# Patient Record
Sex: Female | Born: 1965 | Race: White | Hispanic: No | Marital: Single | Smoking: Never smoker
Health system: Southern US, Community
[De-identification: ages and names within clinical notes are randomized; demographics above are authoritative.]

## PROBLEM LIST (undated history)

## (undated) DIAGNOSIS — I10 Essential (primary) hypertension: Secondary | ICD-10-CM

## (undated) DIAGNOSIS — R519 Headache, unspecified: Secondary | ICD-10-CM

## (undated) DIAGNOSIS — R51 Headache: Secondary | ICD-10-CM

## (undated) HISTORY — DX: Headache: R51

## (undated) HISTORY — DX: Headache, unspecified: R51.9

## (undated) HISTORY — DX: Essential (primary) hypertension: I10

## (undated) HISTORY — PX: TOOTH EXTRACTION: SUR596

## (undated) HISTORY — PX: WISDOM TOOTH EXTRACTION: SHX21

---

## 2013-10-04 ENCOUNTER — Ambulatory Visit: Payer: BC Managed Care – PPO | Admitting: Family Medicine

## 2013-11-05 ENCOUNTER — Ambulatory Visit (INDEPENDENT_AMBULATORY_CARE_PROVIDER_SITE_OTHER): Payer: BC Managed Care – PPO | Admitting: Family Medicine

## 2013-11-05 ENCOUNTER — Other Ambulatory Visit (HOSPITAL_COMMUNITY)
Admission: RE | Admit: 2013-11-05 | Discharge: 2013-11-05 | Disposition: A | Discharge status: Home / Self Care | Payer: BC Managed Care – PPO | Source: Ambulatory Visit | Attending: Family Medicine | Admitting: Family Medicine

## 2013-11-05 ENCOUNTER — Encounter: Payer: Self-pay | Admitting: Family Medicine

## 2013-11-05 VITALS — BP 116/64 | HR 59 | Temp 98.0°F | Ht 70.0 in | Wt 225.5 lb

## 2013-11-05 DIAGNOSIS — Z0000 Encounter for general adult medical examination without abnormal findings: Secondary | ICD-10-CM | POA: Insufficient documentation

## 2013-11-05 DIAGNOSIS — Z136 Encounter for screening for cardiovascular disorders: Secondary | ICD-10-CM

## 2013-11-05 DIAGNOSIS — Z124 Encounter for screening for malignant neoplasm of cervix: Secondary | ICD-10-CM | POA: Insufficient documentation

## 2013-11-05 DIAGNOSIS — Z7689 Persons encountering health services in other specified circumstances: Secondary | ICD-10-CM | POA: Insufficient documentation

## 2013-11-05 DIAGNOSIS — Z1151 Encounter for screening for human papillomavirus (HPV): Secondary | ICD-10-CM | POA: Insufficient documentation

## 2013-11-05 DIAGNOSIS — Z1231 Encounter for screening mammogram for malignant neoplasm of breast: Secondary | ICD-10-CM

## 2013-11-05 DIAGNOSIS — Z01419 Encounter for gynecological examination (general) (routine) without abnormal findings: Secondary | ICD-10-CM

## 2013-11-05 NOTE — Assessment & Plan Note (Signed)
Pap smear today. Order placed for mammogram- she will call to schedule.

## 2013-11-05 NOTE — Assessment & Plan Note (Addendum)
Reviewed preventive care protocols, scheduled due services, and updated immunizations Discussed nutrition, exercise, diet, and healthy lifestyle.  Orders Placed This Encounter  Procedures  . MM Digital Screening  . CBC with Differential  . Comprehensive metabolic panel  . Lipid panel  . TSH     

## 2013-11-05 NOTE — Addendum Note (Signed)
Addended by: Alvina ChouWALSH, Tulio Facundo J on: 11/05/2013 03:23 PM   Modules accepted: Orders

## 2013-11-05 NOTE — Progress Notes (Signed)
Subjective:   Patient ID: [name], female    DOB: 11-22-1965, 48 y.o.   MRN: [id number]  Maymie Brunke is a pleasant 48 y.o. year old female who presents to clinic today with Establish Care  on 11/05/2013  HPI: G2P1 here to establish care and for CPX.  Does not take any regular medications.  LMP started last Thursday. No h/o abnormal pap smears in last 5 years. Last mammogram over 7 years ago.  No family h/o breast, uterine, cervical or colon CA.  She would like screening blood work done today.  Patient Active Problem List   Diagnosis Date Noted  . Routine general medical examination at a health care facility 11/05/2013  . Encounter for routine gynecological examination 11/05/2013   No past medical history on file. Past Surgical History  Procedure Laterality Date  . Tooth extraction    . Wisdom tooth extraction     History  Substance Use Topics  . Smoking status: Never Smoker   . Smokeless tobacco: Never Used  . Alcohol Use: Yes   Family History  Problem Relation Age of Onset  . Cancer Mother   . Hypertension Mother   . Stroke Mother   . Cancer Father    Allergies  Allergen Reactions  . Penicillins    No current outpatient prescriptions on file prior to visit.   No current facility-administered medications on file prior to visit.   The PMH, PSH, Social History, Family History, Medications, and allergies have been reviewed in [facility], and have been updated if relevant.   Review of Systems    See HPI Patient reports no  vision/ hearing changes,anorexia, weight change, fever ,adenopathy, persistant / recurrent hoarseness, swallowing issues, chest pain, edema,persistant / recurrent cough, hemoptysis, dyspnea(rest, exertional, paroxysmal nocturnal), gastrointestinal  bleeding (melena, rectal bleeding), abdominal pain, excessive heart burn, GU symptoms(dysuria, hematuria, pyuria, voiding/incontinence  Issues) syncope, focal weakness, severe memory loss,  concerning skin lesions, depression, anxiety, abnormal bruising/bleeding, major joint swelling, breast masses or abnormal vaginal bleeding.    Objective:    BP 116/64  Pulse 59  Temp(Src) 98 F (36.7 C) (Oral)  Ht 5\' 10"  (1.778 m)  Wt 225 lb 8 oz (102.286 kg)  BMI 32.36 kg/m2  SpO2 99%  LMP 11/01/2013   Physical Exam   General:  Well-developed,well-nourished,in no acute distress; alert,appropriate and cooperative throughout examination Head:  normocephalic and atraumatic.   Eyes:  vision grossly intact, pupils equal, pupils round, and pupils reactive to light.   Ears:  R ear normal and L ear normal.   Nose:  no external deformity.   Mouth:  good dentition.   Neck:  No deformities, masses, or tenderness noted. Breasts:  No mass, nodules, thickening, tenderness, bulging, retraction, inflamation, nipple discharge or skin changes noted.   Lungs:  Normal respiratory effort, chest expands symmetrically. Lungs are clear to auscultation, no crackles or wheezes. Heart:  Normal rate and regular rhythm. S1 and S2 normal without gallop, murmur, click, rub or other extra sounds. Abdomen:  Bowel sounds positive,abdomen soft and non-tender without masses, organomegaly or hernias noted. Rectal:  no external abnormalities.   Genitalia:  Pelvic Exam:        External: normal female genitalia without lesions or masses        Vagina: normal without lesions or masses        Cervix: normal without lesions or masses        Adnexa: normal bimanual exam without masses or fullness  Uterus: normal by palpation        Pap smear: performed Msk:  No deformity or scoliosis noted of thoracic or lumbar spine.   Extremities:  No clubbing, cyanosis, edema, or deformity noted with normal full range of motion of all joints.   Neurologic:  alert & oriented X3 and gait normal.   Skin:  Intact without suspicious lesions or rashes Cervical Nodes:  No lymphadenopathy noted Axillary Nodes:  No palpable  lymphadenopathy Psych:  Cognition and judgment appear intact. Alert and cooperative with normal attention span and concentration. No apparent delusions, illusions, hallucinations       Assessment & Plan:   Routine general medical examination at a health care facility - Plan: CBC with Differential, Comprehensive metabolic panel, TSH  Encounter for routine gynecological examination  Screening for ischemic heart disease - Plan: Lipid panel  Other screening mammogram - Plan: MM Digital Screening No Follow-up on file.

## 2013-11-05 NOTE — Progress Notes (Signed)
Pre visit review using our clinic review tool, if applicable. No additional management support is needed unless otherwise documented below in the visit note. 

## 2013-11-05 NOTE — Patient Instructions (Signed)
It was nice to meet you. We will call you with your lab results and you can view them online.  Please call [facility] to set up your mammogram at your convenience.

## 2013-11-08 ENCOUNTER — Encounter: Payer: Self-pay | Admitting: *Deleted

## 2015-01-01 ENCOUNTER — Ambulatory Visit: Payer: Self-pay | Admitting: Family Medicine

## 2015-01-01 ENCOUNTER — Telehealth: Payer: Self-pay | Admitting: Family Medicine

## 2015-01-01 NOTE — Telephone Encounter (Signed)
Patient did not come in for their appointment today for no energy.  Please let me know if patient needs to be contacted immediately for follow up or no follow up needed.

## 2015-01-01 NOTE — Telephone Encounter (Signed)
She did come in but was late.

## 2015-01-29 ENCOUNTER — Other Ambulatory Visit: Payer: Self-pay

## 2015-01-29 DIAGNOSIS — Z1231 Encounter for screening mammogram for malignant neoplasm of breast: Secondary | ICD-10-CM

## 2015-02-03 ENCOUNTER — Ambulatory Visit
Admission: RE | Admit: 2015-02-03 | Discharge: 2015-02-03 | Disposition: A | Discharge status: Home / Self Care | Payer: BLUE CROSS/BLUE SHIELD | Source: Ambulatory Visit

## 2015-02-03 DIAGNOSIS — Z1231 Encounter for screening mammogram for malignant neoplasm of breast: Secondary | ICD-10-CM

## 2017-05-05 ENCOUNTER — Ambulatory Visit: Payer: BLUE CROSS/BLUE SHIELD | Admitting: Family

## 2017-05-12 ENCOUNTER — Ambulatory Visit (INDEPENDENT_AMBULATORY_CARE_PROVIDER_SITE_OTHER): Payer: BLUE CROSS/BLUE SHIELD | Admitting: Family

## 2017-05-12 ENCOUNTER — Encounter: Payer: Self-pay | Admitting: Family

## 2017-05-12 VITALS — BP 122/68 | HR 70 | Temp 97.9°F | Ht 71.0 in | Wt 233.0 lb

## 2017-05-12 DIAGNOSIS — R51 Headache: Secondary | ICD-10-CM | POA: Diagnosis not present

## 2017-05-12 DIAGNOSIS — Z7689 Persons encountering health services in other specified circumstances: Secondary | ICD-10-CM

## 2017-05-12 DIAGNOSIS — R519 Headache, unspecified: Secondary | ICD-10-CM

## 2017-05-12 MED ORDER — TOPIRAMATE 50 MG PO TABS: 25.0000 mg | ORAL_TABLET | Freq: Every day | ORAL | 0 refills | Status: DC

## 2017-05-12 MED ORDER — TOPIRAMATE 25 MG PO TABS: 25.0000 mg | ORAL_TABLET | Freq: Every day | ORAL | 0 refills | Status: DC

## 2017-05-12 NOTE — Patient Instructions (Addendum)
Schedule 3d mammogram in [place] as discussed  If you need topamax, fine to start however start at  at night.   Return for fasting labs and physical.

## 2017-05-12 NOTE — Assessment & Plan Note (Signed)
Reassured by normal neurologic exam. Discussed with patient that she's not having headaches very frequently however her preference is to use a daily preventative medication as opposed to an abortive treatment.  I advised her start topamax 25 mg dose if she were to start medication and to let me know. Also advised patient to me know if headache frequency, quality were to change.

## 2017-05-12 NOTE — Progress Notes (Signed)
Subjective:    Patient ID: Brianna Wagner, female    DOB: 1966/05/28, 51 y.o.   MRN: [id number]  CC: Brianna Wagner is a 51 y.o. female who presents today to establish care.    HPI: Feeling well today.  Chronic HA's- had been on topamax 50 mg which kept headaches under control. Overall HA's have improved and unchanged from HA's in the past. Last HA week.  HA's couple times per month.  Present bilateral forehead and posterior head. Lasts 3 days, no relief from OTC medications typically.  No numbness, tingling, confusion, aura. No triggers that she could figure out.   Last pcp 1.5 year ago.      HISTORY:  Past Medical History:  Diagnosis Date  . Frequent headaches    Past Surgical History:  Procedure Laterality Date  . TOOTH EXTRACTION    . WISDOM TOOTH EXTRACTION     Family History  Problem Relation Age of Onset  . Cancer Mother   . Hypertension Mother   . Stroke Mother   . Cancer Father   . Cancer Sister        breast    Allergies: Desogestrel-ethinyl estradiol and Penicillins No current outpatient prescriptions on file prior to visit.   No current facility-administered medications on file prior to visit.     Social History  Substance Use Topics  . Smoking status: Never Smoker  . Smokeless tobacco: Never Used  . Alcohol use Yes    Review of Systems  Constitutional: Negative for chills and fever.  Eyes: Negative for visual disturbance.  Respiratory: Negative for cough.   Cardiovascular: Negative for chest pain and palpitations.  Gastrointestinal: Negative for nausea and vomiting.  Neurological: Positive for headaches. Negative for syncope and weakness.      Objective:    BP 122/68   Pulse 70   Temp 97.9 F (36.6 C) (Oral)   Ht  (1.803 m)   Wt 233 lb (105.7 kg)   SpO2 98%   BMI 32.50 kg/m  BP Readings from Last 3 Encounters:  05/12/17 122/68  11/05/13 116/64   Wt Readings from Last 3 Encounters:  05/12/17 233 lb (105.7 kg)  11/05/13 225  lb 8 oz (102.3 kg)    Physical Exam  Constitutional: She appears well-developed and well-nourished.  HENT:  Mouth/Throat: Uvula is midline, oropharynx is clear and moist and mucous membranes are normal.  Eyes: Pupils are equal, round, and reactive to light. Conjunctivae and EOM are normal.  Fundus normal bilaterally.   Cardiovascular: Normal rate, regular rhythm, normal heart sounds and normal pulses.   Pulmonary/Chest: Effort normal and breath sounds normal. She has no wheezes. She has no rhonchi. She has no rales.  Neurological: She is alert. She has normal strength. No cranial nerve deficit or sensory deficit. She displays a negative Romberg sign.  Reflex Scores:      Bicep reflexes are 2+ on the right side and 2+ on the left side.      Patellar reflexes are 2+ on the right side and 2+ on the left side. Grip equal and strong bilateral upper extremities. Gait strong and steady. Able to perform rapid alternating movement without difficulty.   Skin: Skin is warm and dry.  Psychiatric: She has a normal mood and affect. Her speech is normal and behavior is normal. Thought content normal.  Vitals reviewed.      Assessment & Plan:   Problem List Items Addressed This Visit      Other  Encounter to establish care - Primary    Patient return for CPE. Mammogram was ordered today and patient understands to schedule.      Relevant Orders   MM SCREENING BREAST TOMO BILATERAL   Acute nonintractable headache    Reassured by normal neurologic exam. Discussed with patient that she's not having headaches very frequently however her preference is to use a daily preventative medication as opposed to an abortive treatment.  I advised her start topamax 25 mg dose if she were to start medication and to let me know. Also advised patient to me know if headache frequency, quality were to change.      Relevant Medications   topiramate (TOPAMAX) 50 MG tablet       I have discontinued Ms. Matar's  topiramate. I am also having her start on topiramate.   Meds ordered this encounter  Medications  . DISCONTD: topiramate (TOPAMAX) 25 MG tablet    Sig: Take 1 tablet (25 mg total) by mouth at bedtime.    Dispense:  90 tablet    Refill:  0    Order Specific Question:   Supervising Provider    Answer:   Duncan Dull L [[id number]]  . topiramate (TOPAMAX) 50 MG tablet    Sig: Take 0.5 tablets (25 mg total) by mouth at bedtime.    Dispense:  90 tablet    Refill:  0    Order Specific Question:   Supervising Provider    Answer:   Sherlene Shams [[id number]]    Return precautions given.   Risks, benefits, and alternatives of the medications and treatment plan prescribed today were discussed, and patient expressed understanding.   Education regarding symptom management and diagnosis given to patient on AVS.  Continue to follow with [name], FNP for routine health maintenance.   Brianna Wagner and I agreed with plan.   Rennie Plowman, FNP

## 2017-05-12 NOTE — Assessment & Plan Note (Signed)
Patient return for CPE. Mammogram was ordered today and patient understands to schedule.

## 2017-05-12 NOTE — Progress Notes (Signed)
Pre visit review using our clinic review tool, if applicable. No additional management support is needed unless otherwise documented below in the visit note. 

## 2017-06-02 ENCOUNTER — Ambulatory Visit (INDEPENDENT_AMBULATORY_CARE_PROVIDER_SITE_OTHER): Payer: BLUE CROSS/BLUE SHIELD | Admitting: Family

## 2017-06-02 ENCOUNTER — Encounter: Payer: Self-pay | Admitting: Family

## 2017-06-02 VITALS — BP 130/100 | HR 52 | Temp 98.2°F | Resp 14 | Ht 70.75 in | Wt 233.0 lb

## 2017-06-02 DIAGNOSIS — R030 Elevated blood-pressure reading, without diagnosis of hypertension: Secondary | ICD-10-CM

## 2017-06-02 DIAGNOSIS — Z0000 Encounter for general adult medical examination without abnormal findings: Secondary | ICD-10-CM

## 2017-06-02 DIAGNOSIS — R51 Headache: Secondary | ICD-10-CM | POA: Diagnosis not present

## 2017-06-02 DIAGNOSIS — I10 Essential (primary) hypertension: Secondary | ICD-10-CM | POA: Insufficient documentation

## 2017-06-02 DIAGNOSIS — R519 Headache, unspecified: Secondary | ICD-10-CM

## 2017-06-02 MED ORDER — TOPIRAMATE 50 MG PO TABS: 25.0000 mg | ORAL_TABLET | Freq: Every day | ORAL | 0 refills | Status: DC

## 2017-06-02 NOTE — Assessment & Plan Note (Addendum)
Declines colonoscopy at this time. Patient will schedule mammogram. CBE performed. H/o hashimoto's -pending thyroid studies. Labs today.

## 2017-06-02 NOTE — Patient Instructions (Addendum)
Call and schedule mammogram at your conviencene in [place] as discussed  Labs  Monitor blood pressure,  Goal is less than 130/80; if persistently higher, please make sooner follow up appointment so we can recheck you blood pressure and manage medications  Health Maintenance, Female Adopting a healthy lifestyle and getting preventive care can go a long way to promote health and wellness. Talk with your health care provider about what schedule of regular examinations is right for you. This is a good chance for you to check in with your provider about disease prevention and staying healthy. In between checkups, there are plenty of things you can do on your own. Experts have done a lot of research about which lifestyle changes and preventive measures are most likely to keep you healthy. Ask your health care provider for more information. Weight and diet Eat a healthy diet  Be sure to include plenty of vegetables, fruits, low-fat dairy products, and lean protein.  Do not eat a lot of foods high in solid fats, added sugars, or salt.  Get regular exercise. This is one of the most important things you can do for your health. ? Most adults should exercise for at least 150 minutes each week. The exercise should increase your heart rate and make you sweat (moderate-intensity exercise). ? Most adults should also do strengthening exercises at least twice a week. This is in addition to the moderate-intensity exercise.  Maintain a healthy weight  Body mass index (BMI) is a measurement that can be used to identify possible weight problems. It estimates body fat based on height and weight. Your health care provider can help determine your BMI and help you achieve or maintain a healthy weight.  For females 36 years of age and older: ? A BMI below 18.5 is considered underweight. ? A BMI of 18.5 to 24.9 is normal. ? A BMI of 25 to 29.9 is considered overweight. ? A BMI of 30 and above is  considered obese.  Watch levels of cholesterol and blood lipids  You should start having your blood tested for lipids and cholesterol at 50 years of age, then have this test every 5 years.  You may need to have your cholesterol levels checked more often if: ? Your lipid or cholesterol levels are high. ? You are older than 50 years of age. ? You are at high risk for heart disease.  Cancer screening Lung Cancer  Lung cancer screening is recommended for adults 24-72 years old who are at high risk for lung cancer because of a history of smoking.  A yearly low-dose CT scan of the lungs is recommended for people who: ? Currently smoke. ? Have quit within the past 15 years. ? Have at least a 30-pack-year history of smoking. A pack year is smoking an average of one pack of cigarettes a day for 1 year.  Yearly screening should continue until it has been 15 years since you quit.  Yearly screening should stop if you develop a health problem that would prevent you from having lung cancer treatment.  Breast Cancer  Practice breast self-awareness. This means understanding how your breasts normally appear and feel.  It also means doing regular breast self-exams. Let your health care provider know about any changes, no matter how small.  If you are in your 20s or 30s, you should have a clinical breast exam (CBE) by a health care provider every 1-3 years as part of a regular health exam.  If you  are 40 or older, have a CBE every year. Also consider having a breast X-ray (mammogram) every year.  If you have a family history of breast cancer, talk to your health care provider about genetic screening.  If you are at high risk for breast cancer, talk to your health care provider about having an MRI and a mammogram every year.  Breast cancer gene (BRCA) assessment is recommended for women who have family members with BRCA-related cancers. BRCA-related cancers  include: ? Breast. ? Ovarian. ? Tubal. ? Peritoneal cancers.  Results of the assessment will determine the need for genetic counseling and BRCA1 and BRCA2 testing.  Cervical Cancer Your health care provider may recommend that you be screened regularly for cancer of the pelvic organs (ovaries, uterus, and vagina). This screening involves a pelvic examination, including checking for microscopic changes to the surface of your cervix (Pap test). You may be encouraged to have this screening done every 3 years, beginning at age 21.  For women ages 30-65, health care providers may recommend pelvic exams and Pap testing every 3 years, or they may recommend the Pap and pelvic exam, combined with testing for human papilloma virus (HPV), every 5 years. Some types of HPV increase your risk of cervical cancer. Testing for HPV may also be done on women of any age with unclear Pap test results.  Other health care providers may not recommend any screening for nonpregnant women who are considered low risk for pelvic cancer and who do not have symptoms. Ask your health care provider if a screening pelvic exam is right for you.  If you have had past treatment for cervical cancer or a condition that could lead to cancer, you need Pap tests and screening for cancer for at least 20 years after your treatment. If Pap tests have been discontinued, your risk factors (such as having a new sexual partner) need to be reassessed to determine if screening should resume. Some women have medical problems that increase the chance of getting cervical cancer. In these cases, your health care provider may recommend more frequent screening and Pap tests.  Colorectal Cancer  This type of cancer can be detected and often prevented.  Routine colorectal cancer screening usually begins at 50 years of age and continues through 50 years of age.  Your health care provider may recommend screening at an earlier age if you have risk factors  for colon cancer.  Your health care provider may also recommend using home test kits to check for hidden blood in the stool.  A small camera at the end of a tube can be used to examine your colon directly (sigmoidoscopy or colonoscopy). This is done to check for the earliest forms of colorectal cancer.  Routine screening usually begins at age 50.  Direct examination of the colon should be repeated every 5-10 years through 50 years of age. However, you may need to be screened more often if early forms of precancerous polyps or small growths are found.  Skin Cancer  Check your skin from head to toe regularly.  Tell your health care provider about any new moles or changes in moles, especially if there is a change in a mole's shape or color.  Also tell your health care provider if you have a mole that is larger than the size of a pencil eraser.  Always use sunscreen. Apply sunscreen liberally and repeatedly throughout the day.  Protect yourself by wearing long sleeves, pants, a wide-brimmed hat, and sunglasses whenever   you are outside.  Heart disease, diabetes, and high blood pressure  High blood pressure causes heart disease and increases the risk of stroke. High blood pressure is more likely to develop in: ? People who have blood pressure in the high end of the normal range (130-139/85-89 mm Hg). ? People who are overweight or obese. ? People who are African American.  If you are 17-68 years of age, have your blood pressure checked every 3-5 years. If you are 76 years of age or older, have your blood pressure checked every year. You should have your blood pressure measured twice-once when you are at a hospital or clinic, and once when you are not at a hospital or clinic. Record the average of the two measurements. To check your blood pressure when you are not at a hospital or clinic, you can use: ? An automated blood pressure machine at a pharmacy. ? A home blood pressure monitor.  If  you are between 40 years and 61 years old, ask your health care provider if you should take aspirin to prevent strokes.  Have regular diabetes screenings. This involves taking a blood sample to check your fasting blood sugar level. ? If you are at a normal weight and have a low risk for diabetes, have this test once every three years after 50 years of age. ? If you are overweight and have a high risk for diabetes, consider being tested at a younger age or more often. Preventing infection Hepatitis B  If you have a higher risk for hepatitis B, you should be screened for this virus. You are considered at high risk for hepatitis B if: ? You were born in a country where hepatitis B is common. Ask your health care provider which countries are considered high risk. ? Your parents were born in a high-risk country, and you have not been immunized against hepatitis B (hepatitis B vaccine). ? You have HIV or AIDS. ? You use needles to inject street drugs. ? You live with someone who has hepatitis B. ? You have had sex with someone who has hepatitis B. ? You get hemodialysis treatment. ? You take certain medicines for conditions, including cancer, organ transplantation, and autoimmune conditions.  Hepatitis C  Blood testing is recommended for: ? Everyone born from [birth year] through 1964. ? Anyone with known risk factors for hepatitis C.  Sexually transmitted infections (STIs)  You should be screened for sexually transmitted infections (STIs) including gonorrhea and chlamydia if: ? You are sexually active and are younger than 50 years of age. ? You are older than 50 years of age and your health care provider tells you that you are at risk for this type of infection. ? Your sexual activity has changed since you were last screened and you are at an increased risk for chlamydia or gonorrhea. Ask your health care provider if you are at risk.  If you do not have HIV, but are at risk, it may be recommended  that you take a prescription medicine daily to prevent HIV infection. This is called pre-exposure prophylaxis (PrEP). You are considered at risk if: ? You are sexually active and do not regularly use condoms or know the HIV status of your partner(s). ? You take drugs by injection. ? You are sexually active with a partner who has HIV.  Talk with your health care provider about whether you are at high risk of being infected with HIV. If you choose to begin PrEP, you should first  be tested for HIV. You should then be tested every 3 months for as long as you are taking PrEP. Pregnancy  If you are premenopausal and you may become pregnant, ask your health care provider about preconception counseling.  If you may become pregnant, take 400 to 800 micrograms (mcg) of folic acid every day.  If you want to prevent pregnancy, talk to your health care provider about birth control (contraception). Osteoporosis and menopause  Osteoporosis is a disease in which the bones lose minerals and strength with aging. This can result in serious bone fractures. Your risk for osteoporosis can be identified using a bone density scan.  If you are 43 years of age or older, or if you are at risk for osteoporosis and fractures, ask your health care provider if you should be screened.  Ask your health care provider whether you should take a calcium or vitamin D supplement to lower your risk for osteoporosis.  Menopause may have certain physical symptoms and risks.  Hormone replacement therapy may reduce some of these symptoms and risks. Talk to your health care provider about whether hormone replacement therapy is right for you. Follow these instructions at home:  Schedule regular health, dental, and eye exams.  Stay current with your immunizations.  Do not use any tobacco products including cigarettes, chewing tobacco, or electronic cigarettes.  If you are pregnant, do not drink alcohol.  If you are  breastfeeding, limit how much and how often you drink alcohol.  Limit alcohol intake to no more than 1 drink per day for nonpregnant women. One drink equals 12 ounces of beer, 5 ounces of wine, or 1 ounces of hard liquor.  Do not use street drugs.  Do not share needles.  Ask your health care provider for help if you need support or information about quitting drugs.  Tell your health care provider if you often feel depressed.  Tell your health care provider if you have ever been abused or do not feel safe at home. This information is not intended to replace advice given to you by your health care provider. Make sure you discuss any questions you have with your health care provider. Document Released: 02/08/2011 Document Revised: 01/01/2016 Document Reviewed: 04/29/2015 Elsevier Interactive Patient Education  [employer].

## 2017-06-02 NOTE — Progress Notes (Signed)
Subjective:    Patient ID: Brianna Copperorothy Wagner, female    DOB: 1966/02/28, 51 y.o.   MRN: [id number]  CC: Brianna CopperDorothy Rautio is a 51 y.o. female who presents today for physical exam.    HPI: HA- started topamax at last visit, 25mg . Have already noticed decrease in headaches and intensity. Would like to increase to 50mg  qhs.   H/o hashimoto's thyroiditis- had seen Dr Marjorie SmolderMoradi in the past for thyroid uptake scan 2003 which showed hot nodule.  Elevated antibodies at that time. No palpitations, skin changes, cold/heat intolerance        Colorectal Cancer Screening: due; declines at this time.  Breast Cancer Screening: due Cervical Cancer Screening: due; on cycle and declines exam today; will come back for this . Bone Health screening/DEXA for 65+: No increased fracture risk. Defer screening at this time Lung Cancer Screening: Doesn't have 30 year pack year history and age > 55 years.       Tetanus - due         HIV Screening- Candidate for ,declines Labs: Screening labs today. Exercise: Gets regular exercise.  Alcohol use: Moderate Smoking/tobacco use: Nonsmoker.  Regular dental exams: utd Wears seat belt: Yes. Skin: no concerning lesions today; follows with dermatology  HISTORY:  Past Medical History:  Diagnosis Date  . Frequent headaches     Past Surgical History:  Procedure Laterality Date  . TOOTH EXTRACTION    . WISDOM TOOTH EXTRACTION     Family History  Problem Relation Age of Onset  . Cancer Mother   . Hypertension Mother   . Stroke Mother   . Cancer Father   . Cancer Sister        breast      ALLERGIES: Desogestrel-ethinyl estradiol and Penicillins  No current outpatient prescriptions on file prior to visit.   No current facility-administered medications on file prior to visit.     Social History  Substance Use Topics  . Smoking status: Never Smoker  . Smokeless tobacco: Never Used  . Alcohol use Yes    Review of Systems  Constitutional: Negative for  chills, fever and unexpected weight change.  HENT: Negative for congestion.   Respiratory: Negative for cough.   Cardiovascular: Negative for chest pain, palpitations and leg swelling.  Gastrointestinal: Negative for nausea and vomiting.  Genitourinary: Negative for dyspareunia and vaginal pain.  Musculoskeletal: Negative for arthralgias and myalgias.  Skin: Negative for rash.  Neurological: Positive for headaches.  Hematological: Negative for adenopathy.  Psychiatric/Behavioral: Negative for confusion.      Objective:    BP (!) 130/100 (BP Location: Right Arm, Patient Position: Sitting, Cuff Size: Large)   Pulse (!) 52   Temp 98.2 F (36.8 C) (Oral)   Resp 14   Ht 5' 10.75" (1.797 m)   Wt 233 lb (105.7 kg)   LMP 05/31/2017   SpO2 98%   BMI 32.73 kg/m   BP Readings from Last 3 Encounters:  06/02/17 (!) 130/100  05/12/17 122/68  11/05/13 116/64   Wt Readings from Last 3 Encounters:  06/02/17 233 lb (105.7 kg)  05/12/17 233 lb (105.7 kg)  11/05/13 225 lb 8 oz (102.3 kg)    Physical Exam  Constitutional: She appears well-developed and well-nourished.  Eyes: Conjunctivae are normal.  Neck: No thyroid mass and no thyromegaly present.  Cardiovascular: Normal rate, regular rhythm, normal heart sounds and normal pulses.   Pulmonary/Chest: Effort normal and breath sounds normal. She has no wheezes. She has no rhonchi. She  has no rales. Right breast exhibits no inverted nipple, no mass, no nipple discharge, no skin change and no tenderness. Left breast exhibits no inverted nipple, no mass, no nipple discharge, no skin change and no tenderness. Breasts are symmetrical.  CBE performed.   Lymphadenopathy:       Head (right side): No submental, no submandibular, no tonsillar, no preauricular, no posterior auricular and no occipital adenopathy present.       Head (left side): No submental, no submandibular, no tonsillar, no preauricular, no posterior auricular and no occipital  adenopathy present.    She has no cervical adenopathy.       Right cervical: No superficial cervical, no deep cervical and no posterior cervical adenopathy present.      Left cervical: No superficial cervical, no deep cervical and no posterior cervical adenopathy present.    She has no axillary adenopathy.  Neurological: She is alert.  Skin: Skin is warm and dry.  Psychiatric: She has a normal mood and affect. Her speech is normal and behavior is normal. Thought content normal.  Vitals reviewed.      Assessment & Plan:   Problem List Items Addressed This Visit      Other   Routine general medical examination at a health care facility - Primary    Declines colonoscopy at this time. Patient will schedule mammogram. CBE performed. H/o hashimoto's -pending thyroid studies. Labs today.       Relevant Orders   MM SCREENING BREAST TOMO BILATERAL   US THYROID   CBC w/Diff   TSH   Hemoglobin A1c   Vitamin D (25 hydroxy)   Comprehensive metabolic panel   T4, free   T3, free   Lipid panel   Thyrotropin receptor autoabs   Acute nonintractable headache    Improved. Will trial 50mg  topamax qhs.       Relevant Medications   topiramate (TOPAMAX) 50 MG tablet   Elevated blood pressure reading    Elevated today.atypical for patient. She prefers to monitor at home and she will call me and let me know if persists          I am having Ms. Ing maintain her topiramate.   Meds ordered this encounter  Medications  . topiramate (TOPAMAX) 50 MG tablet    Sig: Take 0.5 tablets (25 mg total) by mouth at bedtime.    Dispense:  90 tablet    Refill:  0    Order Specific Question:   Supervising Provider    Answer:   Sherlene Shams [[id number]]    Return precautions given.   Risks, benefits, and alternatives of the medications and treatment plan prescribed today were discussed, and patient expressed understanding.   Education regarding symptom management and diagnosis given to patient on  AVS.   Continue to follow with [name], FNP for routine health maintenance.   Brianna Copper and I agreed with plan.   Rennie Plowman, FNP

## 2017-06-02 NOTE — Assessment & Plan Note (Signed)
Improved. Will trial 50mg  topamax qhs.

## 2017-06-02 NOTE — Assessment & Plan Note (Signed)
Elevated today.atypical for patient. She prefers to monitor at home and she will call me and let me know if persists

## 2017-06-06 LAB — COMPREHENSIVE METABOLIC PANEL
AG RATIO: 1.4 (calc) (ref 1.0–2.5)
ALT: 11 U/L (ref 6–29)
AST: 18 U/L (ref 10–35)
Albumin: 4.2 g/dL (ref 3.6–5.1)
Alkaline phosphatase (APISO): 50 U/L (ref 33–130)
BUN: 15 mg/dL (ref 7–25)
CO2: 25 mmol/L (ref 20–32)
Calcium: 9.4 mg/dL (ref 8.6–10.4)
Chloride: 104 mmol/L (ref 98–110)
Creat: 0.9 mg/dL (ref 0.50–1.05)
Globulin: 3 g/dL (calc) (ref 1.9–3.7)
Glucose, Bld: 91 mg/dL (ref 65–99)
Potassium: 4.1 mmol/L (ref 3.5–5.3)
Sodium: 136 mmol/L (ref 135–146)
Total Bilirubin: 0.7 mg/dL (ref 0.2–1.2)
Total Protein: 7.2 g/dL (ref 6.1–8.1)

## 2017-06-06 LAB — CBC WITH DIFFERENTIAL/PLATELET
BASOS ABS: 72 {cells}/uL (ref 0–200)
Basophils Relative: 1.5 %
Eosinophils Absolute: 154 cells/uL (ref 15–500)
Eosinophils Relative: 3.2 %
HCT: 36 % (ref 35.0–45.0)
Hemoglobin: 11.9 g/dL (ref 11.7–15.5)
Lymphs Abs: 1210 cells/uL (ref 850–3900)
MCH: 28.1 pg (ref 27.0–33.0)
MCHC: 33.1 g/dL (ref 32.0–36.0)
MCV: 85.1 fL (ref 80.0–100.0)
MPV: 11.6 fL (ref 7.5–12.5)
Monocytes Relative: 10.5 %
NEUTROS PCT: 59.6 %
Neutro Abs: 2861 cells/uL (ref 1500–7800)
Platelets: 293 10*3/uL (ref 140–400)
RBC: 4.23 10*6/uL (ref 3.80–5.10)
RDW: 13.3 % (ref 11.0–15.0)
Total Lymphocyte: 25.2 %
WBC mixed population: 504 cells/uL (ref 200–950)
WBC: 4.8 10*3/uL (ref 3.8–10.8)

## 2017-06-06 LAB — LIPID PANEL
CHOLESTEROL: 123 mg/dL (ref <200)
HDL: 49 mg/dL — ABNORMAL LOW (ref >50)
LDL CHOLESTEROL (CALC): 58 mg/dL
Non-HDL Cholesterol (Calc): 74 mg/dL (calc) (ref <130)
Total CHOL/HDL Ratio: 2.5 (calc) (ref <5.0)
Triglycerides: 79 mg/dL (ref <150)

## 2017-06-06 LAB — HEMOGLOBIN A1C
HEMOGLOBIN A1C: 5 %{Hb} (ref <5.7)
MEAN PLASMA GLUCOSE: 97 (calc)
eAG (mmol/L): 5.4 (calc)

## 2017-06-06 LAB — T3, FREE: T3 FREE: 3.1 pg/mL (ref 2.3–4.2)

## 2017-06-06 LAB — T4, FREE: FREE T4: 1 ng/dL (ref 0.8–1.8)

## 2017-06-06 LAB — THYROTROPIN RECEPTOR AUTOABS: Thyrotropin Receptor Ab: <6 % (ref <=16.0)

## 2017-06-06 LAB — TSH: TSH: 1.69 m[IU]/L

## 2017-06-06 LAB — VITAMIN D 25 HYDROXY (VIT D DEFICIENCY, FRACTURES): VIT D 25 HYDROXY: 57 ng/mL (ref 30–100)

## 2017-06-09 ENCOUNTER — Ambulatory Visit: Payer: BLUE CROSS/BLUE SHIELD

## 2017-06-10 ENCOUNTER — Ambulatory Visit
Admission: RE | Admit: 2017-06-10 | Discharge: 2017-06-10 | Disposition: A | Discharge status: Home / Self Care | Payer: BLUE CROSS/BLUE SHIELD | Source: Ambulatory Visit | Attending: Family | Admitting: Family

## 2017-06-10 DIAGNOSIS — E063 Autoimmune thyroiditis: Secondary | ICD-10-CM | POA: Diagnosis present

## 2017-06-10 DIAGNOSIS — Z0000 Encounter for general adult medical examination without abnormal findings: Secondary | ICD-10-CM | POA: Diagnosis not present

## 2017-06-10 DIAGNOSIS — E042 Nontoxic multinodular goiter: Secondary | ICD-10-CM | POA: Diagnosis not present

## 2017-06-11 ENCOUNTER — Other Ambulatory Visit: Payer: Self-pay | Admitting: Family

## 2017-06-11 DIAGNOSIS — E041 Nontoxic single thyroid nodule: Secondary | ICD-10-CM | POA: Insufficient documentation

## 2017-06-13 ENCOUNTER — Other Ambulatory Visit: Payer: Self-pay | Admitting: Family

## 2017-06-13 DIAGNOSIS — Z1231 Encounter for screening mammogram for malignant neoplasm of breast: Secondary | ICD-10-CM

## 2017-07-29 ENCOUNTER — Ambulatory Visit
Admission: RE | Admit: 2017-07-29 | Discharge: 2017-07-29 | Disposition: A | Discharge status: Home / Self Care | Payer: BLUE CROSS/BLUE SHIELD | Source: Ambulatory Visit | Attending: Family | Admitting: Family

## 2017-07-29 DIAGNOSIS — Z1231 Encounter for screening mammogram for malignant neoplasm of breast: Secondary | ICD-10-CM

## 2017-09-07 ENCOUNTER — Ambulatory Visit: Payer: Self-pay

## 2017-09-07 NOTE — Telephone Encounter (Signed)
Just FYI since PCP not in office.

## 2017-09-07 NOTE — Telephone Encounter (Signed)
Pt calling with c/o Chest pressure that feels like a "weight' on her mid upper chest. Onset a week before Christmas and has been constant without radiation to arms, neck, jaw or teeth. Positive for weakness. Pt has h/o HTN but is not on any antihypertensive meds. Reports BP taken 2 days ago and BP 162/90 and repeat BP 142/63. Denies SOB. Pt does not sound like she is in distress at the time of the call. Do not feel 911 is appropriate. Skyped PCP Henrene Pastor) who advised [facility]. Pt works at [employer] in [place]. Advised to go to nearest [facility] or ED and to have someone drive her.  Reason for Disposition . [1] Chest pain lasts > 5 minutes AND [2] age > 50    Pt has been having sx 1 week before Christmas. Pain is constant and pt rates a 2-3 out of 10. Pt refusing to call 911. Wanting dr's appt. PCP triage advises [facility] or ED.  Answer Assessment - Initial Assessment Questions 1. LOCATION: "Where does it hurt?"       Mid chest above breasts 2. RADIATION: "Does the pain go anywhere else?" (e.g., into neck, jaw, arms, back)     no 3. ONSET: "When did the chest pain begin?" (Minutes, hours or days)      Week before Christmas 4. PATTERN "Does the pain come and go, or has it been constant since it started?"  "Does it get worse with exertion?"      Constant-does not get owrse with exertion states walks a mile a day and chest pain does not increase 5. DURATION: "How long does it last" (e.g., seconds, minutes, hours)     constant 6. SEVERITY: "How bad is the pain?"  (e.g., Scale 1-10; mild, moderate, or severe)    - MILD (1-3): doesn't interfere with normal activities     - MODERATE (4-7): interferes with normal activities or awakens from sleep    - SEVERE (8-10): excruciating pain, unable to do any normal activities       Mild 2/10 7. CARDIAC RISK FACTORS: "Do you have any history of heart problems or risk factors for heart disease?" (e.g., prior heart attack, angina; high blood pressure, diabetes, being overweight,  high cholesterol, smoking, or strong family history of heart disease)     Father had MI at 61, pt has HTN 8. PULMONARY RISK FACTORS: "Do you have any history of lung disease?"  (e.g., blood clots in lung, asthma, emphysema, birth control pills)     no 9. CAUSE: "What do you think is causing the chest pain?"     Pt does not know- thought it may be pna 10. OTHER SYMPTOMS: "Do you have any other symptoms?" (e.g., dizziness, nausea, vomiting, sweating, fever, difficulty breathing, cough)       no 11. PREGNANCY: "Is there any chance you are pregnant?" "When was your last menstrual period?"       No LMP: 08/13/17  Answer Assessment - Initial Assessment Questions 1. BLOOD PRESSURE: "What is the blood pressure?" "Did you take at least two measurements 5 minutes apart?"     162/90  And 143/63 2. ONSET: "When did you take your blood pressure?"     2 days ago 3. HOW: "How did you obtain the blood pressure?" (e.g., visiting nurse, automatic home BP monitor)     Automatic BP machine 4. HISTORY: "Do you have a history of high blood pressure?"     no 5. MEDICATIONS: "Are you taking any medications for  blood pressure?" "Have you missed any doses recently?"     No meds 6. OTHER SYMPTOMS: "Do you have any symptoms?" (e.g., headache, chest pain, blurred vision, difficulty breathing, weakness)     Chest pain, weakness 7. PREGNANCY: "Is there any chance you are pregnant?" "When was your last menstrual period?"     n/a  Protocols used: CHEST PAIN-A-AH, HIGH BLOOD PRESSURE-A-AH

## 2017-09-23 ENCOUNTER — Encounter: Payer: Self-pay | Admitting: Family

## 2017-09-23 ENCOUNTER — Other Ambulatory Visit (HOSPITAL_COMMUNITY)
Admission: RE | Admit: 2017-09-23 | Discharge: 2017-09-23 | Disposition: A | Discharge status: Home / Self Care | Payer: BLUE CROSS/BLUE SHIELD | Source: Ambulatory Visit | Attending: Family | Admitting: Family

## 2017-09-23 ENCOUNTER — Ambulatory Visit: Payer: BLUE CROSS/BLUE SHIELD | Admitting: Family

## 2017-09-23 VITALS — BP 130/94 | HR 68 | Temp 99.1°F | Resp 14 | Wt 235.0 lb

## 2017-09-23 DIAGNOSIS — I10 Essential (primary) hypertension: Secondary | ICD-10-CM | POA: Diagnosis not present

## 2017-09-23 DIAGNOSIS — I259 Chronic ischemic heart disease, unspecified: Secondary | ICD-10-CM

## 2017-09-23 DIAGNOSIS — Z124 Encounter for screening for malignant neoplasm of cervix: Secondary | ICD-10-CM | POA: Diagnosis not present

## 2017-09-23 DIAGNOSIS — N920 Excessive and frequent menstruation with regular cycle: Secondary | ICD-10-CM

## 2017-09-23 DIAGNOSIS — E041 Nontoxic single thyroid nodule: Secondary | ICD-10-CM | POA: Diagnosis not present

## 2017-09-23 DIAGNOSIS — Z1211 Encounter for screening for malignant neoplasm of colon: Secondary | ICD-10-CM | POA: Diagnosis not present

## 2017-09-23 DIAGNOSIS — R079 Chest pain, unspecified: Secondary | ICD-10-CM | POA: Insufficient documentation

## 2017-09-23 DIAGNOSIS — R0789 Other chest pain: Secondary | ICD-10-CM | POA: Insufficient documentation

## 2017-09-23 LAB — HM PAP SMEAR: HM Pap smear: NEGATIVE

## 2017-09-23 MED ORDER — AMLODIPINE BESYLATE 2.5 MG PO TABS: 2.5000 mg | ORAL_TABLET | Freq: Every day | ORAL | 3 refills | Status: DC

## 2017-09-23 NOTE — Progress Notes (Signed)
Subjective:    Patient ID: Brianna Wagner, female    DOB: November 15, 1965, 52 y.o.   MRN: [id number]  CC: [name] is a 52 y.o. female who presents today for follow up.   HPI: Describes chest pain for 2 months, 'always there'.  Across the top of chest. Working out and does not get worse during excercise. Describes as can be sharper pain, and chest pressure. Endorses fatigue. Denies numbness or tingling radiating to left arm or jaw, palpitations, dizziness, frequent headaches, changes in vision, or shortness of breath. At times tender to push on chest. Hasn't tried heat, ibuprofen. Started ASA for a couple of weeks and didn't help pain. Notes more stress of late. Sleeps well. Nonsmoker  No congeston or cough when chest pain started.   2 days ago, little clear phelgm cough started.  'slight cough.' No fever, sinus pressure, sore throat, myalgia.  . Using nyquil with some relief.   Father had an MI at 74 years old.   Family h/o melanoma; follow with dermatologist.   Menstrual cycles last from 3-10 days. Clots the size of a pencil eraser. No known bleeding disorder.  No pelvic pain. Not sexual active.   Never had colonscopy   09/07/17 seen in [place] for chest pain ; normal EKG per note ( unable to see actual EKG in chart), CXR normal.   HISTORY:  Past Medical History:  Diagnosis Date  . Frequent headaches    Past Surgical History:  Procedure Laterality Date  . TOOTH EXTRACTION    . WISDOM TOOTH EXTRACTION     Family History  Problem Relation Age of Onset  . Cancer Mother   . Hypertension Mother   . Stroke Mother   . Cancer Father   . Heart attack Father        36 years of age  . Melanoma Father        89 years  . Stomach cancer Father   . Cancer Sister        breast    Allergies: Desogestrel-ethinyl estradiol and Penicillins No current outpatient medications on file prior to visit.   No current facility-administered medications on file prior to visit.     Social  History   Tobacco Use  . Smoking status: Never Smoker  . Smokeless tobacco: Never Used  Substance Use Topics  . Alcohol use: Yes  . Drug use: No    Review of Systems  Constitutional: Negative for chills, fever and unexpected weight change.  HENT: Positive for congestion. Negative for sinus pressure and sore throat.   Respiratory: Positive for cough. Negative for shortness of breath and wheezing.   Cardiovascular: Positive for chest pain. Negative for palpitations and leg swelling.  Gastrointestinal: Negative for nausea and vomiting.  Genitourinary: Positive for menstrual problem and vaginal bleeding. Negative for dyspareunia, dysuria, hematuria and vaginal pain.  Musculoskeletal: Negative for arthralgias and myalgias.  Skin: Negative for rash.  Neurological: Negative for syncope, numbness and headaches.  Hematological: Negative for adenopathy.  Psychiatric/Behavioral: Negative for confusion.      Objective:    BP (!) 130/94 (BP Location: Left Arm, Patient Position: Sitting, Cuff Size: Large)   Pulse 68   Temp 99.1 F (37.3 C) (Oral)   Resp 14   Wt 235 lb (106.6 kg)   SpO2 98%   BMI 33.01 kg/m  BP Readings from Last 3 Encounters:  09/23/17 (!) 130/94  06/02/17 (!) 130/100  05/12/17 122/68   Wt Readings from Last 3 Encounters:  09/23/17 235 lb (106.6 kg)  06/02/17 233 lb (105.7 kg)  05/12/17 233 lb (105.7 kg)    Physical Exam  Constitutional: She appears well-developed and well-nourished.  Eyes: Conjunctivae are normal.  Neck: No thyroid mass and no thyromegaly present.    Cardiovascular: Normal rate, regular rhythm, normal heart sounds and normal pulses.  Pulmonary/Chest: Effort normal and breath sounds normal. She has no wheezes. She has no rhonchi. She has no rales.  Genitourinary: Uterus is not enlarged, not fixed and not tender. Cervix exhibits no motion tenderness, no discharge and no friability. Right adnexum displays no mass, no tenderness and no fullness.  Left adnexum displays no mass, no tenderness and no fullness. No erythema, tenderness or bleeding in the vagina. No foreign body in the vagina. No signs of injury around the vagina. No vaginal discharge found.  Genitourinary Comments: Pap performed. No CMT. Unable to appreciated ovaries. No blood seen in vagina.   Lymphadenopathy:       Head (right side): No submental, no submandibular, no tonsillar, no preauricular, no posterior auricular and no occipital adenopathy present.       Head (left side): No submental, no submandibular, no tonsillar, no preauricular, no posterior auricular and no occipital adenopathy present.       Right cervical: No superficial cervical, no deep cervical and no posterior cervical adenopathy present.      Left cervical: No superficial cervical, no deep cervical and no posterior cervical adenopathy present.    She has no axillary adenopathy.       Right axillary: No pectoral and no lateral adenopathy present.       Left axillary: No pectoral and no lateral adenopathy present. Neurological: She is alert.  Skin: Skin is warm and dry.  Psychiatric: She has a normal mood and affect. Her speech is normal and behavior is normal. Thought content normal.  Vitals reviewed.      Assessment & Plan:   Problem List Items Addressed This Visit      Cardiovascular and Mediastinum   HTN (hypertension)    DBP Elevated. Will start low dose amlodipine.       Relevant Medications   amLODipine (NORVASC) 2.5 MG tablet     Endocrine   Thyroid nodule    revied thyroid US with patient. Advised again that she see endocrine for potential biopsy and printed us report for her. She agrees to see endocrine. Referral placed.      Relevant Orders   Ambulatory referral to Endocrinology     Other   Chest pain due to myocardial ischemia - Primary    Unchanged. Not increased with exercise. EKG reassuring today. [facility]. Based on symptom and family hx,  referral to cardiology for further eval. In meantime, pt will remain hypervigliant and call 911 if any new, worsening symptoms.       Relevant Orders   EKG 12-Lead (Completed)   Ambulatory referral to Cardiology   Screening for cervical cancer   Relevant Orders   Cytology - PAP   Menorrhagia with regular cycle    Pending pap. Cycles regular. Discussed consult with GYN since has been on going for consideration of OCP, surgical options. No anemia seen on labs from 05/2017 and euthyroid. Will hold on labs for now.       Relevant Orders   Ambulatory referral to Obstetrics / Gynecology   Screen for colon cancer   Relevant Orders   Ambulatory referral to Gastroenterology  I have discontinued Brigette Elbaum's topiramate. I am also having her start on amLODipine.   Meds ordered this encounter  Medications  . amLODipine (NORVASC) 2.5 MG tablet    Sig: Take 1 tablet (2.5 mg total) by mouth daily.    Dispense:  90 tablet    Refill:  3    Order Specific Question:   Supervising Provider    Answer:   Sherlene Shams [[id number]]    Return precautions given.   Risks, benefits, and alternatives of the medications and treatment plan prescribed today were discussed, and patient expressed understanding.   Education regarding symptom management and diagnosis given to patient on AVS.  Continue to follow with [name], FNP for routine health maintenance.   Brianna Wagner and I agreed with plan.   Rennie Plowman, FNP

## 2017-09-23 NOTE — Assessment & Plan Note (Signed)
Unchanged. Not increased with exercise. EKG reassuring today. [facility]. Based on symptom and family hx, referral to cardiology for further eval. In meantime, pt will remain hypervigliant and call 911 if any new, worsening symptoms.

## 2017-09-23 NOTE — Assessment & Plan Note (Addendum)
Pending pap. Cycles regular. Discussed consult with GYN since has been on going for consideration of OCP, surgical options. No anemia seen on labs from 05/2017 and euthyroid. Will hold on labs for now.

## 2017-09-23 NOTE — Assessment & Plan Note (Signed)
revied thyroid US with patient. Advised again that she see endocrine for potential biopsy and printed us report for her. She agrees to see endocrine. Referral placed.

## 2017-09-23 NOTE — Patient Instructions (Addendum)
Monitor blood pressure,  Goal is less than 130/80; if persistently higher, please make sooner follow up appointment so we can recheck you blood pressure and manage medications  Start amlodipine  Trial ibuprofen for chest pain.   If any new or worsening symptoms as we discussed regarding chest pain, please go to emergency room   Colonoscopy  F/u 3 months to ensure we are thorough- we went through a lot today!    Heart Attack A heart attack (myocardial infarction, MI) causes damage to the heart that cannot be fixed. A heart attack often happens when a blood clot or other blockage cuts blood flow to the heart. When this happens, certain areas of the heart begin to die. This causes the pain you feel during a heart attack. Follow these instructions at home:  Take medicine as told by your doctor. You may need medicine to: ? Keep your blood from clotting too easily. ? Control your blood pressure. ? Lower your cholesterol. ? Control abnormal heart rhythms.  Change certain behaviors as told by your doctor. This may include: ? Quitting smoking. ? Being active. ? Eating a heart-healthy diet. Ask your doctor for help with this diet. ? Keeping a healthy weight. ? Keeping your diabetes under control. ? Lessening stress. ? Limiting how much alcohol you drink. Do not take these medicines unless your doctor says that you can:  Nonsteroidal anti-inflammatory drugs (NSAIDs). These include: ? Ibuprofen. ? Naproxen. ? Celecoxib.  Vitamin supplements that have vitamin A, vitamin E, or both.  Hormone therapy that contains estrogen with or without progestin.  Get help right away if:  You have sudden chest discomfort.  You have sudden discomfort in your: ? Arms. ? Back. ? Neck. ? Jaw.  You have shortness of breath at any time.  You have sudden sweating or clammy skin.  You feel sick to your stomach (nauseous) or throw up (vomit).  You suddenly get light-headed or dizzy.  You feel  your heart beating fast or skipping beats. These symptoms may be an emergency. Do not wait to see if the symptoms will go away. Get medical help right away. Call your local emergency services (911 in the [place] Do not drive yourself to the hospital. This information is not intended to replace advice given to you by your health care provider. Make sure you discuss any questions you have with your health care provider. Document Released: 01/25/2012 Document Revised: 01/01/2016 Document Reviewed: 09/28/2013 Elsevier Interactive Patient Education  2017 [employer]

## 2017-09-23 NOTE — Assessment & Plan Note (Addendum)
DBP Elevated. Will start low dose amlodipine.

## 2017-09-27 LAB — CYTOLOGY - PAP
Diagnosis: NEGATIVE
HPV: NOT DETECTED

## 2017-10-17 ENCOUNTER — Telehealth: Payer: Self-pay | Admitting: Family

## 2017-10-17 DIAGNOSIS — Z1211 Encounter for screening for malignant neoplasm of colon: Secondary | ICD-10-CM

## 2017-10-17 NOTE — Telephone Encounter (Signed)
Please call [facility] Brianna BaxterJill Wagner [phone]  They refuse ref for colonoscopy  Please tell them she is 52 years old and needs screening.   I have placed ref again as well

## 2017-10-18 NOTE — Telephone Encounter (Signed)
Spoke with [facility]ore[facility]Jill at [facility] and she states that they will schedule once patient has been evaluated by cardiology the

## 2017-10-21 NOTE — Telephone Encounter (Signed)
Noted  

## 2017-11-02 ENCOUNTER — Ambulatory Visit: Payer: BLUE CROSS/BLUE SHIELD | Admitting: Internal Medicine

## 2017-11-02 ENCOUNTER — Encounter: Payer: Self-pay | Admitting: Endocrinology

## 2017-11-02 ENCOUNTER — Encounter: Payer: Self-pay | Admitting: Internal Medicine

## 2017-11-02 ENCOUNTER — Ambulatory Visit: Payer: BLUE CROSS/BLUE SHIELD | Admitting: Endocrinology

## 2017-11-02 VITALS — BP 100/82 | HR 59 | Ht 71.0 in | Wt 237.2 lb

## 2017-11-02 VITALS — BP 118/78 | HR 77 | Ht 70.75 in | Wt 237.0 lb

## 2017-11-02 DIAGNOSIS — R0789 Other chest pain: Secondary | ICD-10-CM | POA: Diagnosis not present

## 2017-11-02 DIAGNOSIS — E041 Nontoxic single thyroid nodule: Secondary | ICD-10-CM | POA: Diagnosis not present

## 2017-11-02 NOTE — Progress Notes (Signed)
Patient ID: Brianna Wagner, female   DOB: 03-13-1966, 52 y.o.   MRN: [id number]          Reason for Appointment: Goiter, new consultation    History of Present Illness:   Patient has been referred by her PCP Rennie PlowmanMargaret Arnett   The patient's thyroid enlargement was first discovered around the year 2004 She has no clear recollection of her initial diagnosis but may have had an abnormal screening thyroid blood test Subsequently was having various thyroid imaging and was told to have a hot nodule No previous treatment with radioactive iodine  She does not recall having been told about any thyroid nodules otherwise and no previous thyroid biopsies have been done No records are available from previous records, she has seen 2 endocrinologist in the past  At her recent physical exam with her new PCP she was evaluated with thyroid levels and ultrasound was done to follow-up on previous problems. She does not feel a swelling in her neck and no thyroid enlargement was felt on her last exam  She had 2 thyroid nodules on the ultrasound done in 06/2017 and the nodule in the isthmus was described as follows:  Maximum size: 1.4 cm; Other 2 dimensions: 1.1 cm x 0.9 cm  Composition: solid/almost completely solid (2)  Echogenicity: isoechoic (1)  Shape: taller-than-wide (3)   Margins: smooth (0)  Echogenic foci: punctate echogenic foci (3)  ACR TI-RADS total points: 9.  She also has a 1.9 cm left inferior nodule that was recommended only follow-up  She has had difficulty no with swallowing   Does not feel like she has any choking sensation in her neck or pressure in any position or when lying down  Also does not complain of any recent unusual fatigue although she thinks she has difficulty losing weight. No heat or cold intolerance or palpitations  Lab Results  Component Value Date   FREET4 1.0 06/02/2017   TSH 1.69 06/02/2017     Allergies as of 11/02/2017      Reactions   Desogestrel-ethinyl Estradiol Other (See Comments)   fatigue   Penicillins       Medication List        Accurate as of 11/02/17  2:08 PM. Always use your most recent med list.          amLODipine 2.5 MG tablet Commonly known as:  NORVASC Take 1 tablet (2.5 mg total) by mouth daily.       Allergies:  Allergies  Allergen Reactions  . Desogestrel-Ethinyl Estradiol Other (See Comments)    fatigue  . Penicillins     Past Medical History:  Diagnosis Date  . Frequent headaches     Past Surgical History:  Procedure Laterality Date  . TOOTH EXTRACTION    . WISDOM TOOTH EXTRACTION      Family History  Problem Relation Age of Onset  . Cancer Mother   . Hypertension Mother   . Stroke Mother   . Cancer Father   . Heart attack Father        51 years of age  . Melanoma Father        89 years  . Stomach cancer Father   . Cancer Sister        breast    Social History:  reports that she has never smoked. She has never used smokeless tobacco. She reports that she drinks alcohol. She reports that she does not use drugs.    Review of Systems  Constitutional:  Positive for weight gain.  HENT: Negative for trouble swallowing.   Respiratory: Negative for shortness of breath.   Cardiovascular: Negative for palpitations.  Gastrointestinal: Negative for diarrhea.  Endocrine: Negative for fatigue.  Musculoskeletal: Negative for joint pain.  Neurological: Negative for weakness.      Examination:   BP 118/78 (BP Location: Left Arm, Patient Position: Sitting, Cuff Size: Large)   Pulse 77   Ht 5' 10.75" (1.797 m)   Wt 237 lb (107.5 kg)   SpO2 98%   BMI 33.29 kg/m    General Appearance: pleasant, averagely built and nourished         Eyes: No abnormal prominence        Neck: The thyroid is nonpalpable, indistinct relatively soft nodules felt in the lower isthmus on the left. Trachea appears normal  There is no stridor. There is no lymphadenopathy.      Cardiovascular: Normal  heart sounds, no murmur Respiratory:  Lungs clear Neurological: REFLEXES: at biceps are normal.  Skin: no rash        Assessment/Plan:  Multinodular goiter, likely long-standing but previous evaluation not available for review  Although she reportedly had a hot nodule in the past did not have overt hyperthyroidism or any previous treatment, this apparently was about 15 years ago  Currently the patient is euthyroid and does not have a palpable goiter or clearly palpable nodules However the patient does have a relatively high risk nodule in the isthmus with a TI-RADS score of 9 Unlikely the patient has had evaluation of this nodule in the past and no previous biopsy done  Although the patient has been reluctant to consider biopsy discussed the technique of the procedure in detail Also reassured her that this is safe and easy to do under ultrasound Also explained to her that the results are occasionally indeterminate but Affirma testing is usually done to confirm diagnosis Patient finally agrees to doing the needle aspiration biopsy  Her left-sided thyroid nodule will also need to be followed in about a year with repeat ultrasound  Weight gain and difficulty losing weight: Discussed that this is not related to her thyroid as this is functioning normally currently   A copy of the consultation note is sent to the referring physician  Reather Littler 11/02/2017

## 2017-11-02 NOTE — Patient Instructions (Addendum)
Medication Instructions:  Your physician recommends that you continue on your current medications as directed. Please refer to the Current Medication list given to you today.   Labwork: none  Testing/Procedures: Your physician has requested that you have an exercise tolerance test. For further information please visit [email or web address] Please also follow instruction sheet, as given.  Avoid caffeine and smoking 24 hours before your procedure. Wear comfortable walking shoes (ie., sneakers).  Follow-Up: Your physician recommends that you schedule a follow-up appointment as needed.    Any Other Special Instructions Will Be Listed Below (If Applicable).     If you need a refill on your cardiac medications before your next appointment, please call your pharmacy.   Exercise Stress Electrocardiogram An exercise stress electrocardiogram is a test to check how blood flows to your heart. It is done to find areas of poor blood flow. You will need to walk on a treadmill for this test. The electrocardiogram will record your heartbeat when you are at rest and when you are exercising. What happens before the procedure?  Do not have drinks with caffeine or foods with caffeine for 24 hours before the test, or as told by your doctor. This includes coffee, tea (even decaf tea), sodas, chocolate, and cocoa.  Follow your doctor's instructions about eating and drinking before the test.  Ask your doctor what medicines you should or should not take before the test. Take your medicines with water unless told by your doctor not to.  If you use an inhaler, bring it with you to the test.  Bring a snack to eat after the test.  Do not  smoke for 4 hours before the test.  Do not put lotions, powders, creams, or oils on your chest before the test.  Wear comfortable shoes and clothing. What happens during the procedure?  You will have patches put on your chest. Small areas of your chest may need to be  shaved. Wires will be connected to the patches.  Your heart rate will be watched while you are resting and while you are exercising.  You will walk on the treadmill. The treadmill will slowly get faster to raise your heart rate.  The test will take about 1-2 hours. What happens after the procedure?  Your heart rate and blood pressure will be watched after the test.  You may return to your normal diet, activities, and medicines or as told by your doctor. This information is not intended to replace advice given to you by your health care provider. Make sure you discuss any questions you have with your health care provider. Document Released: 01/12/2008 Document Revised: 03/24/2016 Document Reviewed: 04/02/2013 Elsevier Interactive Patient Education  [employer]2018 Elsevier Inc.

## 2017-11-02 NOTE — Progress Notes (Signed)
New Outpatient Visit Date: 11/02/2017  Referring Provider: Allegra GranaArnett, Margaret G, FNP [address] [place], [place] [id number]  Chief Complaint: Chest pain  HPI:  [name] is a 52 y.o. female who is being seen today for the evaluation of chest pain at the request of Ms. Arnett. She has a history of frequent headaches. She reports having intermittent chest pain over the last 3 months. It was almost constant for several weeks, though it is now only intermittent, happening about once a week. She describes a central chest pressure (2-3/10 in intensity) without radiation. It is not exertional. There are no exacerbating or alleviating factors other than increased stress around the time that the chest pain began. [facility] and was told that everything was fine following an EKG. She denies a history of prior cardiac disease and testing.  [name] was started on amlodipine recently by her PCP due to elevated blood pressure. However, she stopped it after 3 days due to foot pain that began with the medication. However the pain has persisted despite stopping the medication. She consumes 1-2 cups of caffeinated coffee/day. She works as a Estate agentforklift operator for [employer] and does not exercise regularly.  --------------------------------------------------------------------------------------------------  Cardiovascular History & Procedures: Cardiovascular Problems:  Chest pain  Risk Factors:  Obesity  Cath/PCI:  None  CV Surgery:  None  EP Procedures and Devices:  None  Non-Invasive Evaluation(s):  None  Recent CV Pertinent Labs: Lab Results  Component Value Date   CHOL 123 06/02/2017   HDL 49 (L) 06/02/2017   LDLCALC 58 06/02/2017   TRIG 79 06/02/2017   CHOLHDL 2.5 06/02/2017   K 4.1 06/02/2017   BUN 15 06/02/2017   CREATININE 0.90 06/02/2017     --------------------------------------------------------------------------------------------------  Past Medical History:  Diagnosis Date  . Frequent headaches   . Hypertension     Past Surgical History:  Procedure Laterality Date  . TOOTH EXTRACTION    . WISDOM TOOTH EXTRACTION      Medications: None  Allergies: Desogestrel-ethinyl estradiol and Penicillins  Social History   Socioeconomic History  . Marital status: Single    Spouse name: Not on file  . Number of children: Not on file  . Years of education: Not on file  . Highest education level: Not on file  Occupational History  . Not on file  Social Needs  . Financial resource strain: Not on file  . Food insecurity:    Worry: Not on file    Inability: Not on file  . Transportation needs:    Medical: Not on file    Non-medical: Not on file  Tobacco Use  . Smoking status: Never Smoker  . Smokeless tobacco: Never Used  Substance and Sexual Activity  . Alcohol use: Yes    Alcohol/week: 4.8 oz    Types: 8 Cans of beer per week  . Drug use: No  . Sexual activity: Never  Lifestyle  . Physical activity:    Days per week: Not on file    Minutes per session: Not on file  . Stress: Not on file  Relationships  . Social connections:    Talks on phone: Not on file    Gets together: Not on file    Attends religious service: Not on file    Active member of club or organization: Not on file    Attends meetings of clubs or organizations: Not on file    Relationship status: Not  on file  . Intimate partner violence:    Fear of current or ex partner: Not on file    Emotionally abused: Not on file    Physically abused: Not on file    Forced sexual activity: Not on file  Other Topics Concern  . Not on file  Social History Narrative   [employer]   Lives in [place]   Realtor    Family History  Problem Relation Age of Onset  . Cancer Mother   . Hypertension Mother   . Stroke Mother   . Thyroid  disease Mother   . Heart disease Mother   . Cancer Father   . Heart attack Father        20 years of age  . Melanoma Father        89 years  . Stomach cancer Father   . Heart disease Father   . Cancer Sister        breast  . Thyroid disease Sister   . Thyroid disease Maternal Aunt     Review of Systems: A 12-system review of systems was performed and was negative except as noted in the HPI.  --------------------------------------------------------------------------------------------------  Physical Exam: BP 100/82 (BP Location: Right Arm, Patient Position: Sitting, Cuff Size: Large)   Pulse (!) 59   Ht 5\' 11"  (1.803 m)   Wt 237 lb 4 oz (107.6 kg)   BMI 33.09 kg/m   General:  NAD HEENT: No conjunctival pallor or scleral icterus. Moist mucous membranes. OP clear. Neck: Supple without lymphadenopathy, thyromegaly, JVD, or HJR. No carotid bruit. Lungs: Normal work of breathing. Clear to auscultation bilaterally without wheezes or crackles. Heart: Regular rate and rhythm without murmurs, rubs, or gallops. Non-displaced PMI. Abd: Bowel sounds present. Soft, NT/ND without hepatosplenomegaly Ext: No lower extremity edema. Radial, PT, and DP pulses are 2+ bilaterally Skin: Warm and dry without rash. Neuro: CNIII-XII intact. Strength and fine-touch sensation intact in upper and lower extremities bilaterally. Psych: Normal mood and affect.  EKG:  NSR without abnormalities  Lab Results  Component Value Date   WBC 4.8 06/02/2017   HGB 11.9 06/02/2017   HCT 36.0 06/02/2017   MCV 85.1 06/02/2017   PLT 293 06/02/2017    Lab Results  Component Value Date   NA 136 06/02/2017   K 4.1 06/02/2017   CL 104 06/02/2017   CO2 25 06/02/2017   BUN 15 06/02/2017   CREATININE 0.90 06/02/2017   GLUCOSE 91 06/02/2017   ALT 11 06/02/2017    Lab Results  Component Value Date   CHOL 123 06/02/2017   HDL 49 (L) 06/02/2017   LDLCALC 58 06/02/2017   TRIG 79 06/02/2017   CHOLHDL 2.5  06/02/2017     --------------------------------------------------------------------------------------------------  ASSESSMENT AND PLAN: Atypical chest pain Pain is atypical given its duration and lack of exertional component. Her only significant cardiac risk factor is a family history. We have agreed to obtain an exercise tolerance test. If this is negative, I do not feel that further cardiac assessment is needed unless symptoms worsen. I will not make any medication changes today.  Follow-up: Return to clinic as needed based on results of stress test or if symptoms worsen.  Yvonne Kendall, MD 11/03/2017 5:12 PM

## 2017-11-03 ENCOUNTER — Telehealth: Payer: Self-pay | Admitting: Internal Medicine

## 2017-11-03 ENCOUNTER — Encounter: Payer: Self-pay | Admitting: Internal Medicine

## 2017-11-03 NOTE — Telephone Encounter (Signed)
GXT ordered at yesterday's office visit. Routed to Support Pool to contact patient.

## 2017-11-03 NOTE — Telephone Encounter (Signed)
Attempted to schedule gxt with patient   She needs Friday mornings only and was unable to come at a time when schedule permitted.  Per patient will call again next week when she can look at work schedule for another day/ time

## 2017-11-11 NOTE — Telephone Encounter (Signed)
Pt is scheduled for 11/18/17   Nothing else needed.

## 2017-11-17 ENCOUNTER — Telehealth: Payer: Self-pay | Admitting: *Deleted

## 2017-11-17 NOTE — Telephone Encounter (Signed)
Patient verbalized understanding of appointment date and time tomorrow. As well as no caffeine or smoking for 24 hours prior and to wear comfortable walking shoes.

## 2017-11-18 ENCOUNTER — Ambulatory Visit (INDEPENDENT_AMBULATORY_CARE_PROVIDER_SITE_OTHER): Payer: BLUE CROSS/BLUE SHIELD

## 2017-11-18 DIAGNOSIS — R0789 Other chest pain: Secondary | ICD-10-CM | POA: Diagnosis not present

## 2017-11-23 LAB — EXERCISE TOLERANCE TEST
CSEPED: 7 min
CSEPEW: 10 METS
Exercise duration (sec): 58 s
MPHR: 169 {beats}/min
Peak HR: 160 {beats}/min
Percent HR: 94 %
RPE: 15
Rest HR: 70 {beats}/min

## 2018-03-14 ENCOUNTER — Telehealth: Payer: Self-pay | Admitting: Internal Medicine

## 2018-03-14 DIAGNOSIS — R0789 Other chest pain: Secondary | ICD-10-CM

## 2018-03-14 NOTE — Telephone Encounter (Signed)
Patient calling to schedule 2nd stress test .  No order in epic .  Please advise which test to schedule.

## 2018-03-14 NOTE — Telephone Encounter (Signed)
Ok to proceed with exercise stress echo if the patient is agreeable.  Yvonne Kendallhristopher Annjanette Wertenberger, MD [facility] Pager: [phone]

## 2018-03-14 NOTE — Telephone Encounter (Signed)
Order for stress echocardiogram entered. Routing to scheduling.

## 2018-03-14 NOTE — Telephone Encounter (Signed)
Returned the call to the patient. She stated that she was calling to schedule her GXT. According to the last result note, the patient would need to have an exercise stress echo. She stated that she was willing to move forward with this. Message has been routed to the provider for his recommendations.  Notes recorded by Yvonne KendallEnd, Christopher, MD on 11/23/2017 at 9:44 AM EDT Please let [name] know that her stress test was non-diagnostic due to significant motion artifact that prevented accurate assessment of the EKG. I recommend that we perform an exercise stress echo for further evaluation

## 2018-03-14 NOTE — Telephone Encounter (Signed)
Patient scheduled for 03/30/18

## 2018-03-29 ENCOUNTER — Telehealth: Payer: Self-pay | Admitting: Internal Medicine

## 2018-03-29 NOTE — Telephone Encounter (Signed)
I called and spoke with the patient regarding instructions for her stress echo tomorrow.  - you may eat a light breakfast/ lunch prior to your procedure - no caffeine for 24 hours prior to your test (coffee, tea, soft drinks, or chocolate)  - no smoking/ vaping for 4 hours prior to your test - you may take your regular medications the day of your test except  - wear comfortable clothing & tennis/ non-skid shoes to walk on the treadmill  The patient verbalizes understanding of the above. She is aware her appointment is scheduled for 2:00 pm tomorrow.

## 2018-03-30 ENCOUNTER — Ambulatory Visit (INDEPENDENT_AMBULATORY_CARE_PROVIDER_SITE_OTHER): Payer: BLUE CROSS/BLUE SHIELD

## 2018-03-30 ENCOUNTER — Other Ambulatory Visit: Payer: Self-pay

## 2018-03-30 DIAGNOSIS — R0789 Other chest pain: Secondary | ICD-10-CM | POA: Diagnosis not present

## 2018-03-30 LAB — ECHOCARDIOGRAM STRESS TEST
CHL CUP MPHR: 169 {beats}/min
CSEPEW: 10.1 METS
CSEPPHR: 153 {beats}/min
Exercise duration (min): 8 min
Exercise duration (sec): 35 s
Percent HR: 90 %
Rest BP: 12891 mmHg
Rest HR: 67 {beats}/min

## 2018-06-06 ENCOUNTER — Other Ambulatory Visit: Payer: Self-pay | Admitting: Orthopedic Surgery

## 2018-06-06 DIAGNOSIS — M25561 Pain in right knee: Secondary | ICD-10-CM

## 2018-06-14 ENCOUNTER — Ambulatory Visit
Admission: RE | Admit: 2018-06-14 | Discharge: 2018-06-14 | Disposition: A | Discharge status: Home / Self Care | Payer: BLUE CROSS/BLUE SHIELD | Source: Ambulatory Visit | Attending: Orthopedic Surgery | Admitting: Orthopedic Surgery

## 2018-06-14 DIAGNOSIS — M25561 Pain in right knee: Secondary | ICD-10-CM

## 2019-02-08 ENCOUNTER — Telehealth: Payer: Self-pay | Admitting: Family

## 2019-02-08 DIAGNOSIS — Z0000 Encounter for general adult medical examination without abnormal findings: Secondary | ICD-10-CM

## 2019-02-08 NOTE — Telephone Encounter (Signed)
Pt would like to have labs done before Physical appt on 03/14/2019. Order needed please and thank you!

## 2019-02-12 NOTE — Telephone Encounter (Signed)
Ordered typical CPE fasting labs; please ensure pt is okay with what I ordered  Call her and sch fasting labs

## 2019-02-12 NOTE — Telephone Encounter (Signed)
Patient scheduled for fasting labs 

## 2019-02-27 ENCOUNTER — Other Ambulatory Visit (INDEPENDENT_AMBULATORY_CARE_PROVIDER_SITE_OTHER): Payer: BC Managed Care – PPO

## 2019-02-27 ENCOUNTER — Other Ambulatory Visit: Payer: Self-pay

## 2019-02-27 DIAGNOSIS — Z0000 Encounter for general adult medical examination without abnormal findings: Secondary | ICD-10-CM | POA: Diagnosis not present

## 2019-02-27 NOTE — Addendum Note (Signed)
Addended by: Leeanne Rio on: 02/27/2019 09:46 AM   Modules accepted: Orders

## 2019-02-27 NOTE — Addendum Note (Signed)
Addended by: Leeanne Rio on: 02/27/2019 09:47 AM   Modules accepted: Orders

## 2019-02-28 LAB — COMPREHENSIVE METABOLIC PANEL
AG Ratio: 1.7 (calc) (ref 1.0–2.5)
ALT: 12 U/L (ref 6–29)
AST: 16 U/L (ref 10–35)
Albumin: 4 g/dL (ref 3.6–5.1)
Alkaline phosphatase (APISO): 46 U/L (ref 37–153)
BUN: 15 mg/dL (ref 7–25)
CO2: 21 mmol/L (ref 20–32)
Calcium: 9.2 mg/dL (ref 8.6–10.4)
Chloride: 107 mmol/L (ref 98–110)
Creat: 0.93 mg/dL (ref 0.50–1.05)
Globulin: 2.4 g/dL (calc) (ref 1.9–3.7)
Glucose, Bld: 90 mg/dL (ref 65–99)
Potassium: 4.3 mmol/L (ref 3.5–5.3)
Sodium: 142 mmol/L (ref 135–146)
Total Bilirubin: 0.7 mg/dL (ref 0.2–1.2)
Total Protein: 6.4 g/dL (ref 6.1–8.1)

## 2019-02-28 LAB — CBC WITH DIFFERENTIAL/PLATELET
Absolute Monocytes: 416 cells/uL (ref 200–950)
Basophils Absolute: 59 cells/uL (ref 0–200)
Basophils Relative: 1.4 %
Eosinophils Absolute: 160 cells/uL (ref 15–500)
Eosinophils Relative: 3.8 %
HCT: 38.4 % (ref 35.0–45.0)
Hemoglobin: 12.7 g/dL (ref 11.7–15.5)
Lymphs Abs: 1231 cells/uL (ref 850–3900)
MCH: 30.9 pg (ref 27.0–33.0)
MCHC: 33.1 g/dL (ref 32.0–36.0)
MCV: 93.4 fL (ref 80.0–100.0)
MPV: 11.1 fL (ref 7.5–12.5)
Monocytes Relative: 9.9 %
Neutro Abs: 2335 cells/uL (ref 1500–7800)
Neutrophils Relative %: 55.6 %
Platelets: 244 10*3/uL (ref 140–400)
RBC: 4.11 10*6/uL (ref 3.80–5.10)
RDW: 13.4 % (ref 11.0–15.0)
Total Lymphocyte: 29.3 %
WBC: 4.2 10*3/uL (ref 3.8–10.8)

## 2019-02-28 LAB — HEMOGLOBIN A1C
Hgb A1c MFr Bld: 4.8 % of total Hgb (ref <5.7)
Mean Plasma Glucose: 91 (calc)
eAG (mmol/L): 5 (calc)

## 2019-02-28 LAB — LIPID PANEL
Cholesterol: 123 mg/dL (ref <200)
HDL: 50 mg/dL (ref >=50)
LDL Cholesterol (Calc): 57 mg/dL (calc)
Non-HDL Cholesterol (Calc): 73 mg/dL (calc) (ref <130)
Total CHOL/HDL Ratio: 2.5 (calc) (ref <5.0)
Triglycerides: 80 mg/dL (ref <150)

## 2019-02-28 LAB — VITAMIN D 25 HYDROXY (VIT D DEFICIENCY, FRACTURES): Vit D, 25-Hydroxy: 55 ng/mL (ref 30–100)

## 2019-02-28 LAB — TSH: TSH: 1.52 mIU/L

## 2019-03-13 ENCOUNTER — Other Ambulatory Visit: Payer: Self-pay

## 2019-03-14 ENCOUNTER — Ambulatory Visit (INDEPENDENT_AMBULATORY_CARE_PROVIDER_SITE_OTHER): Payer: BC Managed Care – PPO | Admitting: Family

## 2019-03-14 ENCOUNTER — Other Ambulatory Visit: Payer: Self-pay

## 2019-03-14 ENCOUNTER — Encounter: Payer: Self-pay | Admitting: Family

## 2019-03-14 VITALS — BP 110/80 | HR 60 | Temp 98.7°F | Ht 70.5 in | Wt 236.6 lb

## 2019-03-14 DIAGNOSIS — I10 Essential (primary) hypertension: Secondary | ICD-10-CM | POA: Diagnosis not present

## 2019-03-14 DIAGNOSIS — E041 Nontoxic single thyroid nodule: Secondary | ICD-10-CM | POA: Diagnosis not present

## 2019-03-14 DIAGNOSIS — Z0000 Encounter for general adult medical examination without abnormal findings: Secondary | ICD-10-CM | POA: Diagnosis not present

## 2019-03-14 DIAGNOSIS — I259 Chronic ischemic heart disease, unspecified: Secondary | ICD-10-CM | POA: Diagnosis not present

## 2019-03-14 MED ORDER — OMEPRAZOLE 20 MG PO CPDR: 20.0000 mg | DELAYED_RELEASE_CAPSULE | Freq: Every day | ORAL | 3 refills | Status: DC

## 2019-03-14 NOTE — Progress Notes (Signed)
Subjective:    Patient ID: Brianna Wagner, female    DOB: 1966/03/21, 53 y.o.   MRN: [id number]  CC: Brianna CopperDorothy Wagner is a 53 y.o. female who presents today for physical exam.    HPI: Overall feels well, no new complaints.  Remains frustrated by weight.  She has continued to have right-sided "dull" pressure over her right to central chest.  This is unchanged since she has seen cardiology.  Pain is not necessarily exertional as it occurs with activity and also at rest.   Does not feel anxiety.  Occurs once a week or once every other week.  There is no associated diaphoresis left arm numbness, shortness of breath, palpitations, nausea, vomiting, regurgitation, epigastric burning.  She does not feel this is musculoskeletal.  She declines cardiac consult and EKG at this time.  She does note that she is eating healthier more salads and water.  However she has no formal exercise.  She works at [employer] and does lot of walking, [place] [employer].    She does eat a lot of spicy food, 1 caffeinated drink per day and vinegar on her salads. She wanders if contributory  Seen by Brianna Okey DupreEnd 10/2017.  normal stress test  HTN- no longer on amlodipine.    Thyroid nodule- Brianna Wagner 10/2017; high risk nodule. Never had biopsy. No trouble swallowing  Colorectal Cancer Screening: due Breast Cancer Screening: Mammogram due; declines geneitc referral in setting of strong family risk of breast cancer ( sisters) Cervical Cancer Screening: UTD Bone Health screening/DEXA for 65+: No increased fracture risk. Defer screening at this time. Lung Cancer Screening: Doesn't have 30 year pack year history and age > 55 years.       Tetanus - utd       Labs: Screening labs done prior Exercise: Gets regular exercise.  Alcohol use: regularly, weekly.  Smoking/tobacco use: Nonsmoker.  Wears seat belt: Yes. Skin: follows with dermatology. Notes new non bleeding dry skin on chin.   HISTORY:  Past Medical History:  Diagnosis Date   . Frequent headaches   . Hypertension     Past Surgical History:  Procedure Laterality Date  . TOOTH EXTRACTION    . WISDOM TOOTH EXTRACTION     Family History  Problem Relation Age of Onset  . Cancer Mother [id number]       ?unsure if pancreatic cancer.   . Hypertension Mother   . Stroke Mother   . Thyroid disease Mother   . Heart disease Mother   . Cancer Father   . Heart attack Father        52 years of age  . Melanoma Father        89 years  . Stomach cancer Father   . Heart disease Father   . Cancer Sister [id number]       breast  . Thyroid disease Sister   . Thyroid disease Maternal Aunt   . Breast cancer Sister [id number]      ALLERGIES: Desogestrel-ethinyl estradiol and Penicillins  Current Outpatient Medications on File Prior to Visit  Medication Sig Dispense Refill  . Multiple Vitamin (MULTIVITAMIN) tablet Take 1 tablet by mouth daily.     No current facility-administered medications on file prior to visit.     Social History   Tobacco Use  . Smoking status: Never Smoker  . Smokeless tobacco: Never Used  Substance Use Topics  . Alcohol use: Yes    Alcohol/week: 8.0 standard drinks    Types: 8 Cans  of beer per week    Comment: 2-3 beers per week, approx 2-3 occasions. Social  . Drug use: No    Review of Systems  Constitutional: Negative for chills, fever and unexpected weight change.  HENT: Negative for congestion.   Respiratory: Negative for cough, shortness of breath and wheezing.   Cardiovascular: Positive for chest pain. Negative for palpitations and leg swelling.  Gastrointestinal: Negative for nausea and vomiting.  Musculoskeletal: Negative for arthralgias and myalgias.  Skin: Negative for rash.  Neurological: Negative for headaches.  Hematological: Negative for adenopathy.  Psychiatric/Behavioral: Negative for confusion, sleep disturbance and suicidal ideas.      Objective:    BP 110/80   Pulse 60   Temp 98.7 F (37.1 C)   Ht 5' 10.5" (1.791 m)   Wt  236 lb 9.6 oz (107.3 kg)   SpO2 99%   BMI 33.47 kg/m   BP Readings from Last 3 Encounters:  03/14/19 110/80  11/02/17 100/82  11/02/17 118/78   Wt Readings from Last 3 Encounters:  03/14/19 236 lb 9.6 oz (107.3 kg)  11/02/17 237 lb 4 oz (107.6 kg)  11/02/17 237 lb (107.5 kg)    Physical Exam Vitals signs reviewed.  Constitutional:      Appearance: She is well-developed.  Eyes:     Conjunctiva/sclera: Conjunctivae normal.  Neck:     Thyroid: No thyroid mass or thyromegaly.  Cardiovascular:     Rate and Rhythm: Normal rate and regular rhythm.     Pulses: Normal pulses.     Heart sounds: Normal heart sounds.  Pulmonary:     Effort: Pulmonary effort is normal.     Breath sounds: Normal breath sounds. No wheezing, rhonchi or rales.  Chest:     Breasts: Breasts are symmetrical.        Right: No inverted nipple, mass, nipple discharge, skin change or tenderness.        Left: No inverted nipple, mass, nipple discharge, skin change or tenderness.  Lymphadenopathy:     Head:     Right side of head: No submental, submandibular, tonsillar, preauricular, posterior auricular or occipital adenopathy.     Left side of head: No submental, submandibular, tonsillar, preauricular, posterior auricular or occipital adenopathy.     Cervical: No cervical adenopathy.     Right cervical: No superficial, deep or posterior cervical adenopathy.    Left cervical: No superficial, deep or posterior cervical adenopathy.  Skin:    General: Skin is warm and dry.     Comments: Dry patch noted on chin.   Neurological:     Mental Status: She is alert.  Psychiatric:        Speech: Speech normal.        Behavior: Behavior normal.        Thought Content: Thought content normal.        Assessment & Plan:   Problem List Items Addressed This Visit      Cardiovascular and Mediastinum   HTN (hypertension)    Appears controlled today OFF amlodipine. Advised to continue to monitor.         Endocrine    Thyroid nodule    No biopsy done with endocrine. Advised to call Brianna Wagner to schedule biopsy. She verbalized understanding of importance. Phone number given.         Other   Routine general medical examination at a health care facility - Primary    CBE performed. She declines genetic consult. Deferred pelvic in absence  of complaints, pap utd.  She will schedule mammogram. She will make follow up with dermatology which I strongly advised with patch noted on sun exposed area of chin. She verbalized understanding of all.       Relevant Orders   MM 3D SCREEN BREAST BILATERAL   Ambulatory referral to Gastroenterology   Chest pain due to myocardial ischemia    Unchanged. Atypical. Concerned as chest pain continues to happen weekly or biweekly. Advised repeat EKG today.  Again,  She declines cardiac consult and EKG at this time.  She prefers to trial of prilosec.  Close f/u one month.       Relevant Medications   omeprazole (PRILOSEC) 20 MG capsule       I have discontinued Louise Mcreynolds's amLODipine. I am also having her start on omeprazole. Additionally, I am having her maintain her multivitamin.   Meds ordered this encounter  Medications  . omeprazole (PRILOSEC) 20 MG capsule    Sig: Take 1 capsule (20 mg total) by mouth daily.    Dispense:  30 capsule    Refill:  3    Order Specific Question:   Supervising Provider    Answer:   Crecencio Mc [[id number]]    Return precautions given.   Risks, benefits, and alternatives of the medications and treatment plan prescribed today were discussed, and patient expressed understanding.   Education regarding symptom management and diagnosis given to patient on AVS.   Continue to follow with Burnard Hawthorne, FNP for routine health maintenance.   Merlene Morse and I agreed with plan.   Mable Paris, FNP

## 2019-03-14 NOTE — Patient Instructions (Addendum)
Please call Dr Dwyane Dee ( endocrine) and schedule biopsy of thyroid. This is VERY IMPORTANT. Phone number below.  If you would like referral  to another endocrinologist based on insurance, happy to place that referral.  Please let me know.  [facility] Phone: [phone]    Please call call and schedule your 3D mammogram as discussed.  Today we discussed referrals, orders. GI- colonoscopy   I have placed these orders in the system for you.  Please be sure to give [place] a call if you have not heard from our office regarding this. We should hear from [place] within ONE week with information regarding your appointment. If not, please let me know immediately.    As discussed, I am concerned about your chest pain.  It is reassuring somewhat that this is been the same and unchanged since you saw cardiology, nonetheless, I think we need to monitor this very closely.    Suspect reflux may be playing a role. Please read all informaton below.   We can do a short trial of Prilosec for 1 month.  If you notice no improvement.  Please call the office and let me know because at that point I would like to reconsult cardiology certainly if you have any new or worsening symptoms, left arm pain, numbness, sweating, nausea, headache, shortness of breath, palpitations, please go directly to emergency room   stay safe!  Monitor blood pressure,  Goal is less than 120/80, based on newest guidelines; if persistently higher, please make sooner follow up appointment so we can recheck you blood pressure and manage medications    Food Choices for Gastroesophageal Reflux Disease, Adult When you have gastroesophageal reflux disease (GERD), the foods you eat and your eating habits are very important. Choosing the right foods can help ease your discomfort. Think about working with a nutrition specialist (dietitian) to help you make good choices. What are tips for following this plan?  Meals  Choose healthy foods  that are low in fat, such as fruits, vegetables, whole grains, low-fat dairy products, and lean meat, fish, and poultry.  Eat small meals often instead of 3 large meals a day. Eat your meals slowly, and in a place where you are relaxed. Avoid bending over or lying down until 2-3 hours after eating.  Avoid eating meals 2-3 hours before bed.  Avoid drinking a lot of liquid with meals.  Cook foods using methods other than frying. Bake, grill, or broil food instead.  Avoid or limit: ? Chocolate. ? Peppermint or spearmint. ? Alcohol. ? Pepper. ? Black and decaffeinated coffee. ? Black and decaffeinated tea. ? Bubbly (carbonated) soft drinks. ? Caffeinated energy drinks and soft drinks.  Limit high-fat foods such as: ? Fatty meat or fried foods. ? Whole milk, cream, butter, or ice cream. ? Nuts and nut butters. ? Pastries, donuts, and sweets made with butter or shortening.  Avoid foods that cause symptoms. These foods may be different for everyone. Common foods that cause symptoms include: ? Tomatoes. ? Oranges, lemons, and limes. ? Peppers. ? Spicy food. ? Onions and garlic. ? Vinegar. Lifestyle  Maintain a healthy weight. Ask your doctor what weight is healthy for you. If you need to lose weight, work with your doctor to do so safely.  Exercise for at least 30 minutes for 5 or more days each week, or as told by your doctor.  Wear loose-fitting clothes.  Do not smoke. If you need help quitting, ask your doctor.  Sleep with the head of your bed higher than your feet. Use a wedge under the mattress or blocks under the bed frame to raise the head of the bed. Summary  When you have gastroesophageal reflux disease (GERD), food and lifestyle choices are very important in easing your symptoms.  Eat small meals often instead of 3 large meals a day. Eat your meals slowly, and in a place where you are relaxed.  Limit high-fat foods such as fatty meat or fried foods.  Avoid  bending over or lying down until 2-3 hours after eating.  Avoid peppermint and spearmint, caffeine, alcohol, and chocolate. This information is not intended to replace advice given to you by your health care provider. Make sure you discuss any questions you have with your health care provider. Document Released: 01/25/2012 Document Revised: 11/16/2018 Document Reviewed: 08/31/2016 Elsevier Patient Education  2020 Elsevier Inc.  Nonspecific Chest Pain Chest pain can be caused by many different conditions. Some causes of chest pain can be life-threatening. These will require treatment right away. Serious causes of chest pain include:  Heart attack.  A tear in the body's main blood vessel.  Redness and swelling (inflammation) around your heart.  Blood clot in your lungs. Other causes of chest pain may not be so serious. These include:  Heartburn.  Anxiety or stress.  Damage to bones or muscles in your chest.  Lung infections. Chest pain can feel like:  Pain or discomfort in your chest.  Crushing, pressure, aching, or squeezing pain.  Burning or tingling.  Dull or sharp pain that is worse when you move, cough, or take a deep breath.  Pain or discomfort that is also felt in your back, neck, jaw, shoulder, or arm, or pain that spreads to any of these areas. It is hard to know whether your pain is caused by something that is serious or something that is not so serious. So it is important to see your doctor right away if you have chest pain. Follow these instructions at home: Medicines  Take over-the-counter and prescription medicines only as told by your doctor.  If you were prescribed an antibiotic medicine, take it as told by your doctor. Do not stop taking the antibiotic even if you start to feel better. Lifestyle   Rest as told by your doctor.  Do not use any products that contain nicotine or tobacco, such as cigarettes, e-cigarettes, and chewing tobacco. If you need  help quitting, ask your doctor.  Do not drink alcohol.  Make lifestyle changes as told by your doctor. These may include: ? Getting regular exercise. Ask your doctor what activities are safe for you. ? Eating a heart-healthy diet. A diet and nutrition specialist (dietitian) can help you to learn healthy eating options. ? Staying at a healthy weight. ? Treating diabetes or high blood pressure, if needed. ? Lowering your stress. Activities such as yoga and relaxation techniques can help. General instructions  Pay attention to any changes in your symptoms. Tell your doctor about them or any new symptoms.  Avoid any activities that cause chest pain.  Keep all follow-up visits as told by your doctor. This is important. You may need more testing if your chest pain does not go away. Contact a doctor if:  Your chest pain does not go away.  You feel depressed.  You have a fever. Get help right away if:  Your chest pain is worse.  You have a cough that gets worse, or you cough up  blood.  You have very bad (severe) pain in your belly (abdomen).  You pass out (faint).  You have either of these for no clear reason: ? Sudden chest discomfort. ? Sudden discomfort in your arms, back, neck, or jaw.  You have shortness of breath at any time.  You suddenly start to sweat, or your skin gets clammy.  You feel sick to your stomach (nauseous).  You throw up (vomit).  You suddenly feel lightheaded or dizzy.  You feel very weak or tired.  Your heart starts to beat fast, or it feels like it is skipping beats. These symptoms may be an emergency. Do not wait to see if the symptoms will go away. Get medical help right away. Call your local emergency services (911 in the [place] Do not drive yourself to the hospital. Summary  Chest pain can be caused by many different conditions. The cause may be serious and need treatment right away. If you have chest pain, see your doctor right away.   Follow your doctor's instructions for taking medicines and making lifestyle changes.  Keep all follow-up visits as told by your doctor. This includes visits for any further testing if your chest pain does not go away.  Be sure to know the signs that show that your condition has become worse. Get help right away if you have these symptoms. This information is not intended to replace advice given to you by your health care provider. Make sure you discuss any questions you have with your health care provider. Document Released: 01/12/2008 Document Revised: 01/26/2018 Document Reviewed: 01/26/2018 Elsevier Patient Education  2020 [employer]  Health Maintenance, Female Adopting a healthy lifestyle and getting preventive care are important in promoting health and wellness. Ask your health care provider about:  The right schedule for you to have regular tests and exams.  Things you can do on your own to prevent diseases and keep yourself healthy. What should I know about diet, weight, and exercise? Eat a healthy diet   Eat a diet that includes plenty of vegetables, fruits, low-fat dairy products, and lean protein.  Do not eat a lot of foods that are high in solid fats, added sugars, or sodium. Maintain a healthy weight Body mass index (BMI) is used to identify weight problems. It estimates body fat based on height and weight. Your health care provider can help determine your BMI and help you achieve or maintain a healthy weight. Get regular exercise Get regular exercise. This is one of the most important things you can do for your health. Most adults should:  Exercise for at least 150 minutes each week. The exercise should increase your heart rate and make you sweat (moderate-intensity exercise).  Do strengthening exercises at least twice a week. This is in addition to the moderate-intensity exercise.  Spend less time sitting. Even light physical activity can be beneficial. Watch  cholesterol and blood lipids Have your blood tested for lipids and cholesterol at 52 years of age, then have this test every 5 years. Have your cholesterol levels checked more often if:  Your lipid or cholesterol levels are high.  You are older than 52 years of age.  You are at high risk for heart disease. What should I know about cancer screening? Depending on your health history and family history, you may need to have cancer screening at various ages. This may include screening for:  Breast cancer.  Cervical cancer.  Colorectal cancer.  Skin cancer.  Lung cancer. What  should I know about heart disease, diabetes, and high blood pressure? Blood pressure and heart disease  High blood pressure causes heart disease and increases the risk of stroke. This is more likely to develop in people who have high blood pressure readings, are of African descent, or are overweight.  Have your blood pressure checked: ? Every 3-5 years if you are 3118-52 years of age. ? Every year if you are 52 years old or older. Diabetes Have regular diabetes screenings. This checks your fasting blood sugar level. Have the screening done:  Once every three years after age 52 if you are at a normal weight and have a low risk for diabetes.  More often and at a younger age if you are overweight or have a high risk for diabetes. What should I know about preventing infection? Hepatitis B If you have a higher risk for hepatitis B, you should be screened for this virus. Talk with your health care provider to find out if you are at risk for hepatitis B infection. Hepatitis C Testing is recommended for:  Everyone born from [birth year] through 1965.  Anyone with known risk factors for hepatitis C. Sexually transmitted infections (STIs)  Get screened for STIs, including gonorrhea and chlamydia, if: ? You are sexually active and are younger than 52 years of age. ? You are older than 52 years of age and your health care  provider tells you that you are at risk for this type of infection. ? Your sexual activity has changed since you were last screened, and you are at increased risk for chlamydia or gonorrhea. Ask your health care provider if you are at risk.  Ask your health care provider about whether you are at high risk for HIV. Your health care provider may recommend a prescription medicine to help prevent HIV infection. If you choose to take medicine to prevent HIV, you should first get tested for HIV. You should then be tested every 3 months for as long as you are taking the medicine. Pregnancy  If you are about to stop having your period (premenopausal) and you may become pregnant, seek counseling before you get pregnant.  Take 400 to 800 micrograms (mcg) of folic acid every day if you become pregnant.  Ask for birth control (contraception) if you want to prevent pregnancy. Osteoporosis and menopause Osteoporosis is a disease in which the bones lose minerals and strength with aging. This can result in bone fractures. If you are 15110 years old or older, or if you are at risk for osteoporosis and fractures, ask your health care provider if you should:  Be screened for bone loss.  Take a calcium or vitamin D supplement to lower your risk of fractures.  Be given hormone replacement therapy (HRT) to treat symptoms of menopause. Follow these instructions at home: Lifestyle  Do not use any products that contain nicotine or tobacco, such as cigarettes, e-cigarettes, and chewing tobacco. If you need help quitting, ask your health care provider.  Do not use street drugs.  Do not share needles.  Ask your health care provider for help if you need support or information about quitting drugs. Alcohol use  Do not drink alcohol if: ? Your health care provider tells you not to drink. ? You are pregnant, may be pregnant, or are planning to become pregnant.  If you drink alcohol: ? Limit how much you use to 0-1  drink a day. ? Limit intake if you are breastfeeding.  Be aware of  how much alcohol is in your drink. In the [place] one drink equals one 12 oz bottle of beer (355 mL), one 5 oz glass of wine (148 mL), or one 1 oz glass of hard liquor (44 mL). General instructions  Schedule regular health, dental, and eye exams.  Stay current with your vaccines.  Tell your health care provider if: ? You often feel depressed. ? You have ever been abused or do not feel safe at home. Summary  Adopting a healthy lifestyle and getting preventive care are important in promoting health and wellness.  Follow your health care provider's instructions about healthy diet, exercising, and getting tested or screened for diseases.  Follow your health care provider's instructions on monitoring your cholesterol and blood pressure. This information is not intended to replace advice given to you by your health care provider. Make sure you discuss any questions you have with your health care provider. Document Released: 02/08/2011 Document Revised: 07/19/2018 Document Reviewed: 07/19/2018 Elsevier Patient Education  2020 [employer].

## 2019-03-14 NOTE — Assessment & Plan Note (Signed)
No biopsy done with endocrine. Advised to call Dr Dwyane Dee to schedule biopsy. She verbalized understanding of importance. Phone number given.

## 2019-03-14 NOTE — Assessment & Plan Note (Addendum)
Unchanged. Atypical. Concerned as chest pain continues to happen weekly or biweekly. Advised repeat EKG today.  Again,  She declines cardiac consult and EKG at this time.  She prefers to trial of prilosec.  Close f/u one month.

## 2019-03-14 NOTE — Assessment & Plan Note (Signed)
Appears controlled today OFF amlodipine. Advised to continue to monitor.

## 2019-03-14 NOTE — Assessment & Plan Note (Addendum)
CBE performed. She declines genetic consult. Deferred pelvic in absence of complaints, pap utd.  She will schedule mammogram. She will make follow up with dermatology which I strongly advised with patch noted on sun exposed area of chin. She verbalized understanding of all.

## 2019-04-13 ENCOUNTER — Encounter: Payer: Self-pay | Admitting: Family

## 2019-04-13 ENCOUNTER — Ambulatory Visit (INDEPENDENT_AMBULATORY_CARE_PROVIDER_SITE_OTHER): Payer: BC Managed Care – PPO | Admitting: Family

## 2019-04-13 ENCOUNTER — Other Ambulatory Visit: Payer: Self-pay

## 2019-04-13 VITALS — Ht 70.5 in | Wt 236.0 lb

## 2019-04-13 DIAGNOSIS — N920 Excessive and frequent menstruation with regular cycle: Secondary | ICD-10-CM | POA: Diagnosis not present

## 2019-04-13 DIAGNOSIS — E041 Nontoxic single thyroid nodule: Secondary | ICD-10-CM

## 2019-04-13 DIAGNOSIS — R079 Chest pain, unspecified: Secondary | ICD-10-CM | POA: Diagnosis not present

## 2019-04-13 NOTE — Assessment & Plan Note (Signed)
Etiology unclear. Unable to examine patient in office.  Advised that she needs to see GYN for potential US pelvic, endometrial biopsy.

## 2019-04-13 NOTE — Assessment & Plan Note (Signed)
Number provided again for Dr Dwyane Dee. She understands she needs to call and make this appointment.

## 2019-04-13 NOTE — Assessment & Plan Note (Signed)
Continue to advise patient that she needs an EKG and further follow up with Dr End. I advised that I cannot rule out cardiac causes.  She politely declines further work up and feels prilosec has been helpful. Discussed differentials including cardiac, GERD, PUD. Advised to abstain from alcohol. Continue prilosec. She has an upcoming GI appointment. Education provided on signs and symptoms of heart attack. Will follow.

## 2019-04-13 NOTE — Patient Instructions (Addendum)
Abstain from alcohol.   Let me know if vaginal bleeding worsens or you would like office visit for this.   Please monitor for symptoms , particularly chest pain. If this recurs, persists or presents with symptoms as discussed below under Heart Attack,  please go to nearest emergency room.  Please call Dr Lucianne MussKumar ( [phone])  and ensure you keep appointment with Bluford KaufmannJanice Bridges at GI.  Today we discussed referrals, orders. GYN for bleeding    I have placed these orders in the system for you.  Please be sure to give us a call if you have not heard from our office regarding this. We should hear from us within ONE week with information regarding your appointment. If not, please let me know immediately.    Stay safe!   Gastroesophageal Reflux Disease, Adult Gastroesophageal reflux (GER) happens when acid from the stomach flows up into the tube that connects the mouth and the stomach (esophagus). Normally, food travels down the esophagus and stays in the stomach to be digested. With GER, food and stomach acid sometimes move back up into the esophagus. You may have a disease called gastroesophageal reflux disease (GERD) if the reflux:  Happens often.  Causes frequent or very bad symptoms.  Causes problems such as damage to the esophagus. When this happens, the esophagus becomes sore and swollen (inflamed). Over time, GERD can make small holes (ulcers) in the lining of the esophagus. What are the causes? This condition is caused by a problem with the muscle between the esophagus and the stomach. When this muscle is weak or not normal, it does not close properly to keep food and acid from coming back up from the stomach. The muscle can be weak because of:  Tobacco use.  Pregnancy.  Having a certain type of hernia (hiatal hernia).  Alcohol use.  Certain foods and drinks, such as coffee, chocolate, onions, and peppermint. What increases the risk? You are more likely to develop this  condition if you:  Are overweight.  Have a disease that affects your connective tissue.  Use NSAID medicines. What are the signs or symptoms? Symptoms of this condition include:  Heartburn.  Difficult or painful swallowing.  The feeling of having a lump in the throat.  A bitter taste in the mouth.  Bad breath.  Having a lot of saliva.  Having an upset or bloated stomach.  Belching.  Chest pain. Different conditions can cause chest pain. Make sure you see your doctor if you have chest pain.  Shortness of breath or noisy breathing (wheezing).  Ongoing (chronic) cough or a cough at night.  Wearing away of the surface of teeth (tooth enamel).  Weight loss. How is this treated? Treatment will depend on how bad your symptoms are. Your doctor may suggest:  Changes to your diet.  Medicine.  Surgery. Follow these instructions at home: Eating and drinking   Follow a diet as told by your doctor. You may need to avoid foods and drinks such as: ? Coffee and tea (with or without caffeine). ? Drinks that contain alcohol. ? Energy drinks and sports drinks. ? Bubbly (carbonated) drinks or sodas. ? Chocolate and cocoa. ? Peppermint and mint flavorings. ? Garlic and onions. ? Horseradish. ? Spicy and acidic foods. These include peppers, chili powder, curry powder, vinegar, hot sauces, and BBQ sauce. ? Citrus fruit juices and citrus fruits, such as oranges, lemons, and limes. ? Tomato-based foods. These include red sauce, chili, salsa, and pizza with red  sauce. ? Fried and fatty foods. These include donuts, french fries, potato chips, and high-fat dressings. ? High-fat meats. These include hot dogs, rib eye steak, sausage, ham, and bacon. ? High-fat dairy items, such as whole milk, butter, and cream cheese.  Eat small meals often. Avoid eating large meals.  Avoid drinking large amounts of liquid with your meals.  Avoid eating meals during the 2-3 hours before  bedtime.  Avoid lying down right after you eat.  Do not exercise right after you eat. Lifestyle   Do not use any products that contain nicotine or tobacco. These include cigarettes, e-cigarettes, and chewing tobacco. If you need help quitting, ask your doctor.  Try to lower your stress. If you need help doing this, ask your doctor.  If you are overweight, lose an amount of weight that is healthy for you. Ask your doctor about a safe weight loss goal. General instructions  Pay attention to any changes in your symptoms.  Take over-the-counter and prescription medicines only as told by your doctor. Do not take aspirin, ibuprofen, or other NSAIDs unless your doctor says it is okay.  Wear loose clothes. Do not wear anything tight around your waist.  Raise (elevate) the head of your bed about 6 inches (15 cm).  Avoid bending over if this makes your symptoms worse.  Keep all follow-up visits as told by your doctor. This is important. Contact a doctor if:  You have new symptoms.  You lose weight and you do not know why.  You have trouble swallowing or it hurts to swallow.  You have wheezing or a cough that keeps happening.  Your symptoms do not get better with treatment.  You have a hoarse voice. Get help right away if:  You have pain in your arms, neck, jaw, teeth, or back.  You feel sweaty, dizzy, or light-headed.  You have chest pain or shortness of breath.  You throw up (vomit) and your throw-up looks like blood or coffee grounds.  You pass out (faint).  Your poop (stool) is bloody or black.  You cannot swallow, drink, or eat. Summary  If a person has gastroesophageal reflux disease (GERD), food and stomach acid move back up into the esophagus and cause symptoms or problems such as damage to the esophagus.  Treatment will depend on how bad your symptoms are.  Follow a diet as told by your doctor.  Take all medicines only as told by your doctor. This  information is not intended to replace advice given to you by your health care provider. Make sure you discuss any questions you have with your health care provider. Document Released: 01/12/2008 Document Revised: 02/01/2018 Document Reviewed: 02/01/2018 Elsevier Patient Education  2020 [place] A heart attack occurs when blood and oxygen supply to the heart is cut off. A heart attack causes damage to the heart that cannot be fixed. A heart attack is also called a myocardial infarction, or MI. If you think you are having a heart attack, do not wait to see if the symptoms will go away. Get medical help right away. What are the causes? This condition may be caused by:  A fatty substance (plaque) in the blood vessels (arteries). This can block the flow of blood to the heart.  A blood clot in the blood vessels that go to the heart. The blood clot blocks blood flow.  Low blood pressure.  An abnormal heartbeat.  Some diseases, such as problems in red blood  cells (anemia)orproblems in breathing (respiratory failure).  Tightening (spasm) of a blood vessel that cuts off blood to the heart.  A tear in a blood vessel of the heart.  High blood pressure. What increases the risk? The following factors may make you more likely to develop this condition:  Aging. The older you are, the higher your risk.  Having a personal or family history of chest pain, heart attack, stroke, or narrowing of the arteries in the legs, arms, head, or stomach (peripheral artery disease).  Being female.  Smoking.  Not getting regular exercise.  Being overweight or obese.  Having high blood pressure.  Having high cholesterol.  Having diabetes.  Drinking too much alcohol.  Using illegal drugs, such as cocaine or methamphetamine. What are the signs or symptoms? Symptoms of this condition include:  Chest pain. It may feel like: ? Crushing or squeezing. ? Tightness, pressure, fullness, or  heaviness.  Pain in the arm, neck, jaw, back, or upper body.  Shortness of breath.  Heartburn.  Upset stomach (indigestion).  Feeling like you may vomit (nauseous).  Cold sweats.  Feeling tired.  Sudden light-headedness. How is this treated? A heart attack must be treated as soon as possible. Treatment may include:  Medicines to: ? Break up or dissolve blood clots. ? Thin blood and help prevent blood clots. ? Treat blood pressure. ? Improve blood flow to the heart. ? Reduce pain. ? Reduce cholesterol.  Procedures to widen a blocked artery and keep it open.  Open heart surgery.  Receiving oxygen.  Making your heart strong again (cardiac rehabilitation) through exercise, education, and counseling. Follow these instructions at home: Medicines  Take over-the-counter and prescription medicines only as told by your doctor. You may need to take medicine: ? To keep your blood from clotting too easily. ? To control blood pressure. ? To lower cholesterol. ? To control heart rhythms.  Do not take these medicines unless your doctor says it is okay: ? NSAIDs, such as ibuprofen. ? Supplements that have vitamin A, vitamin E, or both. ? Hormone replacement therapy that has estrogen with or without progestin. Lifestyle      Do not use any products that have nicotine or tobacco, such as cigarettes, e-cigarettes, and chewing tobacco. If you need help quitting, ask your doctor.  Avoid secondhand smoke.  Exercise regularly. Ask your doctor about a cardiac rehab program.  Eat heart-healthy foods. Your doctor will tell you what foods to eat.  Stay at a healthy weight.  Lower your stress level.  Do not use illegal drugs. Alcohol use  Do not drink alcohol if: ? Your doctor tells you not to drink. ? You are pregnant, may be pregnant, or are planning to become pregnant.  If you drink alcohol: ? Limit how much you use to:  0-1 drink a day for women.  0-2 drinks a day  for men. ? Know how much alcohol is in your drink. In the [place] one drink equals one 12 oz bottle of beer (355 mL), one 5 oz glass of wine (148 mL), or one 1 oz glass of hard liquor (44 mL). General instructions  Work with your doctor to treat other problems you may have, such as diabetes or high blood pressure.  Get screened for depression. Get treatment if needed.  Keep your vaccines up to date. Get the flu shot (influenza vaccine) every year.  Keep all follow-up visits as told by your doctor. This is important. Contact a doctor if:  You feel very sad.  You have trouble doing your daily activities. Get help right away if:  You have sudden, unexplained discomfort in your chest, arms, back, neck, jaw, or upper body.  You have shortness of breath.  You have sudden sweating or clammy skin.  You feel like you may vomit.  You vomit.  You feel tired or weak.  You get light-headed or dizzy.  You feel your heart beating fast.  You feel your heart skipping beats.  You have blood pressure that is higher than 180/120. These symptoms may be an emergency. Do not wait to see if the symptoms will go away. Get medical help right away. Call your local emergency services (911 in the [place] Do not drive yourself to the hospital. Summary  A heart attack occurs when blood and oxygen supply to the heart is cut off.  Do not take NSAIDs unless your doctor says it is okay.  Do not smoke. Avoid secondhand smoke.  Exercise regularly. Ask your doctor about a cardiac rehab program. This information is not intended to replace advice given to you by your health care provider. Make sure you discuss any questions you have with your health care provider. Document Released: 01/25/2012 Document Revised: 11/06/2018 Document Reviewed: 11/06/2018 Elsevier Patient Education  2020 [employer].

## 2019-04-13 NOTE — Progress Notes (Signed)
This visit type was conducted due to national recommendations for restrictions regarding the COVID-19 pandemic (e.g. social distancing).  This format is felt to be most appropriate for this patient at this time.  All issues noted in this document were discussed and addressed.  No physical exam was performed (except for noted visual exam findings with Video Visits). Virtual Visit via Video Note  I connected with@  on 04/13/19 at  4:00 PM EDT by a video enabled telemedicine application and verified that I am speaking with the correct person using two identifiers.  Location patient: home Location provider:work  Persons participating in the virtual visit: patient, provider  I discussed the limitations of evaluation and management by telemedicine and the availability of in person appointments. The patient expressed understanding and agreed to proceed.  Interactive audio and video telecommunications were attempted between this provider and patient, however failed, due to patient having technical difficulties or patient did not have access to video capability.  We continued and completed visit with audio only.    HPI:  Complains of menses 'light' for 3 weeks. NO pelvic pain, dizziness. NO large clots. Prior too had had regular menses every month. Wearing pad every day.  Pap UTD, normal.   CP-improved. describes on right side of chest. Happens about 2x per week, lasts for half hour, then goes away on its own. Rare NSAID use. Has decreased beer from couple per day to now a couple beers per day. No weight loss.   Describes chest pain as 'dull pain' , not a crushing sensation. Feels that prilosec has helped. No rhyme or reason to pain. More notes when driving or before/after meals.   No epigastric burning, numbness or tingling radiating to left arm or jaw, palpitations, dizziness, frequent headaches, changes in vision, or shortness of breath.   Has consult scheduled with Ronney Asters at [facility].    Hasnt seen Dr Dwyane Dee for biopsy ; would like his phone number.   Has seen Dr Saunders Revel 10/2017.     ROS: See pertinent positives and negatives per HPI.  Past Medical History:  Diagnosis Date  . Frequent headaches   . Hypertension     Past Surgical History:  Procedure Laterality Date  . TOOTH EXTRACTION    . WISDOM TOOTH EXTRACTION      Family History  Problem Relation Age of Onset  . Cancer Mother 49       ?unsure if pancreatic cancer.   . Hypertension Mother   . Stroke Mother   . Thyroid disease Mother   . Heart disease Mother   . Cancer Father   . Heart attack Father        1 years of age  . Melanoma Father        89 years  . Stomach cancer Father   . Heart disease Father   . Cancer Sister 78       breast  . Thyroid disease Sister   . Thyroid disease Maternal Aunt   . Breast cancer Sister 19    SOCIAL HX: never smoker  Current Outpatient Medications:  Marland Kitchen  Multiple Vitamin (MULTIVITAMIN) tablet, Take 1 tablet by mouth daily., Disp: , Rfl:  .  omeprazole (PRILOSEC) 20 MG capsule, Take 1 capsule (20 mg total) by mouth daily., Disp: 30 capsule, Rfl: 3  EXAM:  VITALS per patient if applicable:  GENERAL: alert, oriented, appears well and in no acute distress  HEENT: atraumatic, conjunttiva clear, no obvious abnormalities on inspection of external nose  and ears  NECK: normal movements of the head and neck  LUNGS: on inspection no signs of respiratory distress, breathing rate appears normal, no obvious gross SOB, gasping or wheezing  CV: no obvious cyanosis  MS: moves all visible extremities without noticeable abnormality  PSYCH/NEURO: pleasant and cooperative, no obvious depression or anxiety, speech and thought processing grossly intact  ASSESSMENT AND PLAN:  Discussed the following assessment and plan:  Problem List Items Addressed This Visit      Endocrine   Thyroid nodule    Number provided again for Dr Lucianne MussKumar. She understands she needs to call and  make this appointment.         Other   Nonspecific chest pain    Continue to advise patient that she needs an EKG and further follow up with Dr End. I advised that I cannot rule out cardiac causes.  She politely declines further work up and feels prilosec has been helpful. Discussed differentials including cardiac, GERD, PUD. Advised to abstain from alcohol. Continue prilosec. She has an upcoming GI appointment. Education provided on signs and symptoms of heart attack. Will follow.       Menorrhagia with regular cycle - Primary    Etiology unclear. Unable to examine patient in office.  Advised that she needs to see GYN for potential US pelvic, endometrial biopsy.        Relevant Orders   Ambulatory referral to Obstetrics / Gynecology         I discussed the assessment and treatment plan with the patient. The patient was provided an opportunity to ask questions and all were answered. The patient agreed with the plan and demonstrated an understanding of the instructions.   The patient was advised to call back or seek an in-person evaluation if the symptoms worsen or if the condition fails to improve as anticipated.   [name], FNP   I spent 25 min non face to face w/ pt.

## 2019-04-23 NOTE — Progress Notes (Signed)
Printed and mailed

## 2019-04-25 ENCOUNTER — Encounter: Payer: Self-pay | Admitting: Obstetrics and Gynecology

## 2019-04-25 ENCOUNTER — Other Ambulatory Visit: Payer: Self-pay

## 2019-04-25 ENCOUNTER — Ambulatory Visit (INDEPENDENT_AMBULATORY_CARE_PROVIDER_SITE_OTHER): Payer: BC Managed Care – PPO | Admitting: Obstetrics and Gynecology

## 2019-04-25 VITALS — BP 114/72 | HR 50 | Ht 71.0 in | Wt 231.9 lb

## 2019-04-25 DIAGNOSIS — N938 Other specified abnormal uterine and vaginal bleeding: Secondary | ICD-10-CM | POA: Diagnosis not present

## 2019-04-25 MED ORDER — NORETHINDRONE 0.35 MG PO TABS: 1.0000 | ORAL_TABLET | Freq: Every day | ORAL | 0 refills | Status: DC

## 2019-04-25 NOTE — Progress Notes (Signed)
HPI:      [name] is a 53 y.o. No obstetric history on file. who LMP was Patient's last menstrual period was 03/14/2019 (approximate).  Subjective:   She presents today with complaint of 3 weeks of irregular bleeding.  2 weeks of spotting after her menses and then heavy week of bleeding before her menses was due.  Prior to this she was having normal regular cycles lasting 5 days/month. She is not currently sexually active and using no form of birth control. She has never had irregular bleeding like this before. She denies symptoms of menopause.    Hx: The following portions of the patient's history were reviewed and updated as appropriate:             She  has a past medical history of Frequent headaches and Hypertension. She does not have any pertinent problems on file. She  has a past surgical history that includes Tooth extraction and Wisdom tooth extraction. Her family history includes Breast cancer (age of onset: 4050) in her sister; Cancer in her father; Cancer (age of onset: 4450) in her sister; Cancer (age of onset: 8077) in her mother; Heart attack in her father; Heart disease in her father and mother; Hypertension in her mother; Melanoma in her father; Stomach cancer in her father; Stroke in her mother; Thyroid disease in her maternal aunt, mother, and sister. She  reports that she has never smoked. She has never used smokeless tobacco. She reports current alcohol use of about 8.0 standard drinks of alcohol per week. She reports that she does not use drugs. She has a current medication list which includes the following prescription(s): multivitamin, omeprazole, and norethindrone. She is allergic to desogestrel-ethinyl estradiol and penicillins.       Review of Systems:  Review of Systems  Constitutional: Denied constitutional symptoms, night sweats, recent illness, fatigue, fever, insomnia and weight loss.  Eyes: Denied eye symptoms, eye pain, photophobia, vision change and  visual disturbance.  Ears/Nose/Throat/Neck: Denied ear, nose, throat or neck symptoms, hearing loss, nasal discharge, sinus congestion and sore throat.  Cardiovascular: Denied cardiovascular symptoms, arrhythmia, chest pain/pressure, edema, exercise intolerance, orthopnea and palpitations.  Respiratory: Denied pulmonary symptoms, asthma, pleuritic pain, productive sputum, cough, dyspnea and wheezing.  Gastrointestinal: Denied, gastro-esophageal reflux, melena, nausea and vomiting.  Genitourinary: See HPI for additional information.  Musculoskeletal: Denied musculoskeletal symptoms, stiffness, swelling, muscle weakness and myalgia.  Dermatologic: Denied dermatology symptoms, rash and scar.  Neurologic: Denied neurology symptoms, dizziness, headache, neck pain and syncope.  Psychiatric: Denied psychiatric symptoms, anxiety and depression.  Endocrine: Denied endocrine symptoms including hot flashes and night sweats.   Meds:   Current Outpatient Medications on File Prior to Visit  Medication Sig Dispense Refill  . Multiple Vitamin (MULTIVITAMIN) tablet Take 1 tablet by mouth daily.    Marland Kitchen. omeprazole (PRILOSEC) 20 MG capsule Take 1 capsule (20 mg total) by mouth daily. 30 capsule 3   No current facility-administered medications on file prior to visit.     Objective:     Vitals:   04/25/19 1104  BP: 114/72  Pulse: (!) 50              Most recent Pap smear and pelvic examination reviewed -normal  Assessment:    No obstetric history on file. Patient Active Problem List   Diagnosis Date Noted  . Nonspecific chest pain 09/23/2017  . Screening for cervical cancer 09/23/2017  . Menorrhagia with regular cycle 09/23/2017  . Screen for colon cancer  09/23/2017  . Thyroid nodule 06/11/2017  . HTN (hypertension) 06/02/2017  . Acute nonintractable headache 05/12/2017  . Routine general medical examination at a health care facility 11/05/2013  . Encounter to establish care 11/05/2013      1. DUB (dysfunctional uterine bleeding)     Possibly climacteric, possible single irregular cycle.   Plan:            1.  I have offered the patient several options and we have discussed each in detail. A.  Expectant management assuming her cycles will return-if irregular bleeding continues further work-up necessary B.  Endometrial biopsy to rule out hyperplasia or malignancy-I believe both of these are low risk for her as her cycles have been normal her whole life and this is her first episode of irregular bleeding C.  1 month of cycle control followed by expectant management. ([facility])  Patient has chosen option C.  If her normal menses returns nothing further to do.  If she continues to experience irregular bleeding recommend endometrial biopsy.  If her cycles stop consider G[facility]y for diagnosis of menopause. Orders No orders of the defined types were placed in this encounter.    Meds ordered this encounter  Medications  . norethindrone (MICRONOR) 0.35 MG tablet    Sig: Take 1 tablet (0.35 mg total) by mouth daily.    Dispense:  1 Package    Refill:  0      F/U  No follow-ups on file. I spent 25 minutes involved in the care of this patient of which greater than 50% was spent discussing dysfunctional uterine bleeding causes, multiple options of work-up with risks and benefits of each.  All questions answered.  See above.  Finis Bud, M.D. 04/25/2019 11:36 AM

## 2019-04-25 NOTE — Progress Notes (Signed)
Patient comes in today for NP appointment. She is having irregular bleeding.

## 2019-04-26 ENCOUNTER — Ambulatory Visit
Admission: RE | Admit: 2019-04-26 | Discharge: 2019-04-26 | Disposition: A | Discharge status: Home / Self Care | Payer: BC Managed Care – PPO | Source: Ambulatory Visit | Attending: Family | Admitting: Family

## 2019-04-26 DIAGNOSIS — Z0000 Encounter for general adult medical examination without abnormal findings: Secondary | ICD-10-CM

## 2019-05-07 ENCOUNTER — Other Ambulatory Visit: Payer: Self-pay

## 2019-05-07 NOTE — Progress Notes (Signed)
Patient ID: Brianna Wagner, female   DOB: [birth year], 53 y.o.   MRN: [id number]          Reason for Appointment: Goiter, follow-up consultation    History of Present Illness:   Patient has been referred by her PCP Mable Paris   The patient's thyroid enlargement was first discovered around the year 2004 She has no clear recollection of her initial diagnosis but may have had an abnormal screening thyroid blood test Subsequently was having various thyroid imaging and was told to have a hot nodule No previous treatment with radioactive iodine  She does not recall having been told about any thyroid nodules otherwise and no previous thyroid biopsies have been done No records are available from previous records, she has seen 2 endocrinologist in the past  She saw her new PCP in 2018 when she was evaluated with thyroid levels and ultrasound was done She was then referred here for follow-up  RECENT HISTORY:  She does not feel a swelling in her neck and no thyroid enlargement was felt on her last exam In 2019 she was recommended needle aspiration on her nodule in the isthmus However she did not go for this as recommended She is now here for further follow-up  She has had difficulty no with swallowing   Does not feel like she has any choking sensation in her neck or pressure in any position or when lying down  Her only complaint recently is fatigue  ULTRASOUND done in 06/2017 and the nodule in the isthmus was described as follows:  Maximum size: 1.4 cm; Other 2 dimensions: 1.1 cm x 0.9 cm  Composition: solid/almost completely solid (2)  Echogenicity: isoechoic (1)  Shape: taller-than-wide (3)   Margins: smooth (0)  Echogenic foci: punctate echogenic foci (3)  ACR TI-RADS total points: 9.  She also has a 1.9 cm left inferior nodule that was recommended only follow-up  FATIGUE:  She has been having fatigue for quite some time She wakes up feeling tired She has a feeling  of having to walk up 5 or 6 flights of stairs during the day Does have some history of snoring but no daytime somnolence No numbness in her toes or feet No joint pains except some right knee pain She is perimenopausal and is just starting norethindrone from her gynecologist  Lab Results  Component Value Date   FREET4 1.0 06/02/2017   TSH 1.52 02/27/2019   TSH 1.69 06/02/2017     Allergies as of 05/08/2019      Reactions   Desogestrel-ethinyl Estradiol Other (See Comments)   fatigue   Penicillins       Medication List       Accurate as of May 08, 2019  1:54 PM. If you have any questions, ask your nurse or doctor.        multivitamin tablet Take 1 tablet by mouth daily.   norethindrone 0.35 MG tablet Commonly known as: MICRONOR Take 1 tablet (0.35 mg total) by mouth daily.   omeprazole 20 MG capsule Commonly known as: PRILOSEC Take 1 capsule (20 mg total) by mouth daily.       Allergies:  Allergies  Allergen Reactions  . Desogestrel-Ethinyl Estradiol Other (See Comments)    fatigue  . Penicillins     Past Medical History:  Diagnosis Date  . Frequent headaches   . Hypertension     Past Surgical History:  Procedure Laterality Date  . TOOTH EXTRACTION    . WISDOM TOOTH EXTRACTION  Family History  Problem Relation Age of Onset  . Cancer Mother 40       ?unsure if pancreatic cancer.   . Hypertension Mother   . Stroke Mother   . Thyroid disease Mother   . Heart disease Mother   . Cancer Father   . Heart attack Father        29 years of age  . Melanoma Father        89 years  . Stomach cancer Father   . Heart disease Father   . Cancer Sister 73       breast  . Thyroid disease Sister   . Thyroid disease Maternal Aunt   . Breast cancer Sister 59    Social History:  reports that she has never smoked. She has never used smokeless tobacco. She reports current alcohol use of about 8.0 standard drinks of alcohol per week. She reports that  she does not use drugs.    Review of Systems    Examination:   BP 117/72   Pulse 74   Temp 98.5 F (36.9 C)   Wt 236 lb (107 kg)   LMP 03/14/2019 (Approximate)   SpO2 98%   BMI 32.92 kg/m   Thyroid not palpable No cushingoid features on her exam No pedal edema   Assessment/Plan:  Multinodular goiter, likely long-standing but previous imaging results not available for review May have had a hot nodule in the past However subsequently her thyroid levels have been normal  Again today does not have a palpable nodule on her exam  Her previous history, labs, imaging studies were reviewed again in detail  However the patient does have a relatively high risk nodule in the isthmus with a TI-RADS score of 9 as of 2018 but she has not gone for biopsy as recommended Since it has been almost 2 years since her last ultrasound would be beneficial to do another ultrasound Discussed the procedure for ultrasound and how this would be able to get a specimen for cytology  Her left-sided thyroid nodule will also need to be followed up  FATIGUE: Etiology of this is unclear, has had discussion with her PCP and gynecologist and apparently depression and menopausal symptoms have been excluded Discussed with her that we should check for secondary hypothyroidism and B12 deficiency even though she is taking OTC B vitamins on her own  Follow-up in 1 year unless her labs are abnormal or needs further follow-up for her nodule  Total visit time for evaluation and management of multiple problems and counseling =25 minutes    Reather Littler 05/08/2019

## 2019-05-08 ENCOUNTER — Ambulatory Visit: Payer: BC Managed Care – PPO | Admitting: Endocrinology

## 2019-05-08 ENCOUNTER — Other Ambulatory Visit: Payer: Self-pay

## 2019-05-08 ENCOUNTER — Encounter: Payer: Self-pay | Admitting: Endocrinology

## 2019-05-08 VITALS — BP 117/72 | HR 74 | Temp 98.5°F | Wt 236.0 lb

## 2019-05-08 DIAGNOSIS — R5383 Other fatigue: Secondary | ICD-10-CM | POA: Diagnosis not present

## 2019-05-08 DIAGNOSIS — E041 Nontoxic single thyroid nodule: Secondary | ICD-10-CM

## 2019-05-08 LAB — T4, FREE: Free T4: 0.8 ng/dL (ref 0.60–1.60)

## 2019-05-08 LAB — VITAMIN B12: Vitamin B-12: 1453 pg/mL — ABNORMAL HIGH (ref 211–911)

## 2019-05-08 NOTE — Progress Notes (Signed)
Please call to let patient know that the lab results are normal and no further action needed

## 2019-05-14 ENCOUNTER — Telehealth: Payer: Self-pay | Admitting: Obstetrics and Gynecology

## 2019-05-14 NOTE — Telephone Encounter (Signed)
Patient called and stated that she is experiencing heavy bleeding and is needing to speak with Dr. Amalia Hailey or nurse as soon as possible. Pt stated that she already sent a MyChart message. Please advise.

## 2019-05-15 ENCOUNTER — Telehealth: Payer: Self-pay | Admitting: Obstetrics and Gynecology

## 2019-05-15 NOTE — Telephone Encounter (Signed)
Spoke with patient and she is having heavy bleeding . She is bleeding through tampon and a pad. I have scheduled her to come in tomorrow to discuss further with Dr. Amalia Hailey. Patient denies weakness or dizziness. I told her I would give her a call back.

## 2019-05-15 NOTE — Telephone Encounter (Signed)
Patient called stating she is bleeding heavily changing a pad every hour and has not received a call back from yesterday. -KEC

## 2019-05-15 NOTE — Telephone Encounter (Signed)
Waiting for response from Dr. Amalia Hailey to patients my chart message.

## 2019-05-16 ENCOUNTER — Encounter: Payer: Self-pay | Admitting: Obstetrics and Gynecology

## 2019-05-16 ENCOUNTER — Ambulatory Visit (INDEPENDENT_AMBULATORY_CARE_PROVIDER_SITE_OTHER): Payer: BC Managed Care – PPO | Admitting: Obstetrics and Gynecology

## 2019-05-16 ENCOUNTER — Other Ambulatory Visit: Payer: Self-pay

## 2019-05-16 VITALS — BP 128/78 | HR 89 | Ht 71.0 in | Wt 233.9 lb

## 2019-05-16 DIAGNOSIS — N938 Other specified abnormal uterine and vaginal bleeding: Secondary | ICD-10-CM | POA: Diagnosis not present

## 2019-05-16 MED ORDER — NORETHINDRONE ACETATE 5 MG PO TABS: 5.0000 mg | ORAL_TABLET | Freq: Two times a day (BID) | ORAL | 0 refills | Status: DC

## 2019-05-16 NOTE — Progress Notes (Signed)
Patient comes in today for PMB. It has been heavy for the last several days.

## 2019-05-16 NOTE — Progress Notes (Signed)
HPI:      [name] is a 53 y.o. No obstetric history on file. who LMP was Patient's last menstrual period was 03/14/2019 (approximate).  Subjective:   She presents today because her menstrual period started and she said that it has been extremely heavy over the last 3 days.  She reports it is somewhat better today. She had dysfunctional uterine bleeding approximately a month ago and was begun on a months worth of Micronor pills.  She is 17 days into the pills and obviously they have not kept her from bleeding. Prior to these last few months the patient had normal regular menstrual cycles her entire life. She reports no signs or symptoms of menopause at this time.    Hx: The following portions of the patient's history were reviewed and updated as appropriate:             She  has a past medical history of Frequent headaches and Hypertension. She does not have any pertinent problems on file. She  has a past surgical history that includes Tooth extraction and Wisdom tooth extraction. Her family history includes Breast cancer (age of onset: 22) in her sister; Cancer in her father; Cancer (age of onset: 30) in her sister; Cancer (age of onset: 84) in her mother; Heart attack in her father; Heart disease in her father and mother; Hypertension in her mother; Melanoma in her father; Stomach cancer in her father; Stroke in her mother; Thyroid disease in her maternal aunt, mother, and sister. She  reports that she has never smoked. She has never used smokeless tobacco. She reports current alcohol use of about 8.0 standard drinks of alcohol per week. She reports that she does not use drugs. She has a current medication list which includes the following prescription(s): multivitamin, norethindrone, norethindrone, and omeprazole. She is allergic to desogestrel-ethinyl estradiol and penicillins.       Review of Systems:  Review of Systems  Constitutional: Denied constitutional symptoms, night  sweats, recent illness, fatigue, fever, insomnia and weight loss.  Eyes: Denied eye symptoms, eye pain, photophobia, vision change and visual disturbance.  Ears/Nose/Throat/Neck: Denied ear, nose, throat or neck symptoms, hearing loss, nasal discharge, sinus congestion and sore throat.  Cardiovascular: Denied cardiovascular symptoms, arrhythmia, chest pain/pressure, edema, exercise intolerance, orthopnea and palpitations.  Respiratory: Denied pulmonary symptoms, asthma, pleuritic pain, productive sputum, cough, dyspnea and wheezing.  Gastrointestinal: Denied, gastro-esophageal reflux, melena, nausea and vomiting.  Genitourinary: See HPI for additional information.  Musculoskeletal: Denied musculoskeletal symptoms, stiffness, swelling, muscle weakness and myalgia.  Dermatologic: Denied dermatology symptoms, rash and scar.  Neurologic: Denied neurology symptoms, dizziness, headache, neck pain and syncope.  Psychiatric: Denied psychiatric symptoms, anxiety and depression.  Endocrine: Denied endocrine symptoms including hot flashes and night sweats.   Meds:   Current Outpatient Medications on File Prior to Visit  Medication Sig Dispense Refill  . Multiple Vitamin (MULTIVITAMIN) tablet Take 1 tablet by mouth daily.    . norethindrone (MICRONOR) 0.35 MG tablet Take 1 tablet (0.35 mg total) by mouth daily. 1 Package 0  . omeprazole (PRILOSEC) 20 MG capsule Take 1 capsule (20 mg total) by mouth daily. 30 capsule 3   No current facility-administered medications on file prior to visit.     Objective:     Vitals:   05/16/19 0959  BP: 128/78  Pulse: 89                Assessment:    No obstetric history on file. Patient  Active Problem List   Diagnosis Date Noted  . Nonspecific chest pain 09/23/2017  . Screening for cervical cancer 09/23/2017  . Menorrhagia with regular cycle 09/23/2017  . Screen for colon cancer 09/23/2017  . Thyroid nodule 06/11/2017  . HTN (hypertension) 06/02/2017   . Acute nonintractable headache 05/12/2017  . Routine general medical examination at a health care facility 11/05/2013  . Encounter to establish care 11/05/2013     1. DUB (dysfunctional uterine bleeding)     Patient probably climacteric.  Doubt postmenopausal.  Patient having breakthrough bleeding despite progesterone.   Plan:            1.  Will attempt to stop her bleeding using Aygestin.  After bleeding stops perform endometrial biopsy to rule out hyperplasia and endometrial polyp.  If biopsy benign consider OCPs for cycle control until menopause is reached.  Orders No orders of the defined types were placed in this encounter.    Meds ordered this encounter  Medications  . norethindrone (AYGESTIN) 5 MG tablet    Sig: Take 1 tablet (5 mg total) by mouth 2 (two) times daily for 21 days. As directed    Dispense:  42 tablet    Refill:  0      F/U  Return in about 1 week (around 05/23/2019). I spent 18 minutes involved in the care of this patient of which greater than 50% was spent discussing breakthrough bleeding on progesterone pills, endometrial polyps and hyperplasia, necessity of endometrial biopsy, future management with OCPs.  All questions answered.  Elonda Husky, M.D. 05/16/2019 10:28 AM

## 2019-05-24 LAB — HM COLONOSCOPY

## 2019-06-05 ENCOUNTER — Ambulatory Visit: Payer: BC Managed Care – PPO | Admitting: Obstetrics and Gynecology

## 2019-06-05 ENCOUNTER — Other Ambulatory Visit (HOSPITAL_COMMUNITY)
Admission: RE | Admit: 2019-06-05 | Discharge: 2019-06-05 | Disposition: A | Discharge status: Home / Self Care | Payer: BC Managed Care – PPO | Source: Ambulatory Visit | Attending: Obstetrics and Gynecology | Admitting: Obstetrics and Gynecology

## 2019-06-05 ENCOUNTER — Encounter: Payer: Self-pay | Admitting: Obstetrics and Gynecology

## 2019-06-05 ENCOUNTER — Other Ambulatory Visit: Payer: Self-pay

## 2019-06-05 VITALS — BP 139/85 | HR 71 | Ht 71.0 in | Wt 236.8 lb

## 2019-06-05 DIAGNOSIS — N938 Other specified abnormal uterine and vaginal bleeding: Secondary | ICD-10-CM | POA: Insufficient documentation

## 2019-06-05 MED ORDER — NORETHINDRONE ACETATE 5 MG PO TABS: 5.0000 mg | ORAL_TABLET | Freq: Two times a day (BID) | ORAL | 0 refills | Status: DC

## 2019-06-05 NOTE — Progress Notes (Signed)
HPI:      [name] is a 53 y.o. No obstetric history on file. who LMP was Patient's last menstrual period was 03/14/2019 (approximate).  Subjective:   She presents today because her bleeding has stopped on the Aygestin and she presents for endometrial biopsy.  She was previously on a different progesterone medication and she had breakthrough bleeding.  She is not yet in menopause.    Hx: The following portions of the patient's history were reviewed and updated as appropriate:             She  has a past medical history of Frequent headaches and Hypertension. She does not have any pertinent problems on file. She  has a past surgical history that includes Tooth extraction and Wisdom tooth extraction. Her family history includes Breast cancer (age of onset: 92) in her sister; Cancer in her father; Cancer (age of onset: 60) in her sister; Cancer (age of onset: 49) in her mother; Heart attack in her father; Heart disease in her father and mother; Hypertension in her mother; Melanoma in her father; Stomach cancer in her father; Stroke in her mother; Thyroid disease in her maternal aunt, mother, and sister. She  reports that she has never smoked. She has never used smokeless tobacco. She reports current alcohol use of about 8.0 standard drinks of alcohol per week. She reports that she does not use drugs. She has a current medication list which includes the following prescription(s): multivitamin, norethindrone, norethindrone, norethindrone, and omeprazole. She is allergic to desogestrel-ethinyl estradiol and penicillins.       Review of Systems:  Review of Systems  Constitutional: Denied constitutional symptoms, night sweats, recent illness, fatigue, fever, insomnia and weight loss.  Eyes: Denied eye symptoms, eye pain, photophobia, vision change and visual disturbance.  Ears/Nose/Throat/Neck: Denied ear, nose, throat or neck symptoms, hearing loss, nasal discharge, sinus congestion and sore  throat.  Cardiovascular: Denied cardiovascular symptoms, arrhythmia, chest pain/pressure, edema, exercise intolerance, orthopnea and palpitations.  Respiratory: Denied pulmonary symptoms, asthma, pleuritic pain, productive sputum, cough, dyspnea and wheezing.  Gastrointestinal: Denied, gastro-esophageal reflux, melena, nausea and vomiting.  Genitourinary: Denied genitourinary symptoms including symptomatic vaginal discharge, pelvic relaxation issues, and urinary complaints.  Musculoskeletal: Denied musculoskeletal symptoms, stiffness, swelling, muscle weakness and myalgia.  Dermatologic: Denied dermatology symptoms, rash and scar.  Neurologic: Denied neurology symptoms, dizziness, headache, neck pain and syncope.  Psychiatric: Denied psychiatric symptoms, anxiety and depression.  Endocrine: Denied endocrine symptoms including hot flashes and night sweats.   Meds:   Current Outpatient Medications on File Prior to Visit  Medication Sig Dispense Refill  . Multiple Vitamin (MULTIVITAMIN) tablet Take 1 tablet by mouth daily.    . norethindrone (AYGESTIN) 5 MG tablet Take 1 tablet (5 mg total) by mouth 2 (two) times daily for 21 days. As directed 42 tablet 0  . norethindrone (MICRONOR) 0.35 MG tablet Take 1 tablet (0.35 mg total) by mouth daily. 1 Package 0  . omeprazole (PRILOSEC) 20 MG capsule Take 1 capsule (20 mg total) by mouth daily. 30 capsule 3   No current facility-administered medications on file prior to visit.     Objective:     Vitals:   06/05/19 1410  BP: 139/85  Pulse: 71              Physical examination   Pelvic:   Vulva: Normal appearance.  No lesions.  Vagina: No lesions or abnormalities noted.  Support: Normal pelvic support.  Urethra No masses tenderness or  scarring.  Meatus Normal size without lesions or prolapse.  Cervix: Normal appearance.  No lesions.  Anus: Normal exam.  No lesions.  Perineum: Normal exam.  No lesions.        Bimanual   Uterus: Normal  size.  Non-tender.  Mobile.  AV.  Adnexae: No masses.  Non-tender to palpation.  Cul-de-sac: Negative for abnormality.   Endometrial Biopsy After discussion with the patient regarding her abnormal uterine bleeding I recommended that she proceed with an endometrial biopsy for further diagnosis. The risks, benefits, alternatives, and indications for an endometrial biopsy were discussed with the patient in detail. She understood the risks including infection, bleeding, cervical laceration and uterine perforation.  Verbal consent was obtained.   PROCEDURE NOTE:  Vacurette endometrial biopsy was performed using aseptic technique with iodine preparation.  The uterus was sounded to a length of 9 cm.  Adequate sampling was obtained with minimal blood loss.  The patient tolerated the procedure well.  Disposition will be pending pathology   Assessment:    No obstetric history on file. Patient Active Problem List   Diagnosis Date Noted  . Nonspecific chest pain 09/23/2017  . Screening for cervical cancer 09/23/2017  . Menorrhagia with regular cycle 09/23/2017  . Screen for colon cancer 09/23/2017  . Thyroid nodule 06/11/2017  . HTN (hypertension) 06/02/2017  . Acute nonintractable headache 05/12/2017  . Routine general medical examination at a health care facility 11/05/2013  . Encounter to establish care 11/05/2013     1. DUB (dysfunctional uterine bleeding)     Endometrial biopsy performed.  Patient not currently bleeding on Aygestin   Plan:            1.  Continue Aygestin until results return and then will discuss future management for long-term bleeding control.  Expectant management versus OCPs versus cyclic progesterone versus IUD. Orders No orders of the defined types were placed in this encounter.    Meds ordered this encounter  Medications  . norethindrone (AYGESTIN) 5 MG tablet    Sig: Take 1 tablet (5 mg total) by mouth 2 (two) times daily. As directed    Dispense:  60  tablet    Refill:  0      F/U  Return for We will contact her with any abnormal test results. I spent 19 minutes involved in the care of this patient of which greater than 50% was spent discussing dysfunctional bleeding, continued work-up including endometrial biopsy, diagnostic possibilities/differential diagnosis, possible future treatments.  All questions answered.  Elonda Husky, M.D. 06/05/2019 2:47 PM

## 2019-06-08 LAB — SURGICAL PATHOLOGY

## 2019-11-27 ENCOUNTER — Telehealth (INDEPENDENT_AMBULATORY_CARE_PROVIDER_SITE_OTHER): Payer: BC Managed Care – PPO | Admitting: Family

## 2019-11-27 ENCOUNTER — Encounter: Payer: Self-pay | Admitting: Family

## 2019-11-27 ENCOUNTER — Telehealth: Payer: Self-pay | Admitting: Family

## 2019-11-27 VITALS — Ht 71.0 in | Wt 236.0 lb

## 2019-11-27 DIAGNOSIS — R5382 Chronic fatigue, unspecified: Secondary | ICD-10-CM | POA: Diagnosis not present

## 2019-11-27 DIAGNOSIS — R200 Anesthesia of skin: Secondary | ICD-10-CM

## 2019-11-27 DIAGNOSIS — R5383 Other fatigue: Secondary | ICD-10-CM | POA: Insufficient documentation

## 2019-11-27 NOTE — Progress Notes (Signed)
Names of all persons present for services: Rennie Plowman, NP and patient Chief complaint:  Left lateral leg numbness and tingling x 3 weeks, unchanged. Numbness doesn't reach to calf or foot. No leg pain. No falls or known injury at work.  No triggers however more noticeable at night. Will try to lay on right side and it doesn't resolve. Walking normally.  No leg swelling, calf pain. NO trouble urinating, saddle anesthesia, constipation.  No h/o dvt.  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  No regular exercise outside of work. No CP.  Vaginal bleeding has been improved, no bleeding for 2 months.  Continues to feel 'sluggish.' Sleeping well however some days doesn't feel sleep is restorative.  Notes snores. No HA. Average sleep is 7 hours. Working 2 jobs. 'Not sure of depression playing a role.' Not tearful. 'enjoys life'. No si/hi.  h/o depression during divorced in 2008.  Colonoscopy repeat History, background, results pertinent:  No recent h/o back pain.  Has seen Dr [name] for DUB 05/2019. Started on Aygestin.   A/P/next steps: Problem List Items Addressed This Visit      Other   Fatigue    Chronic. No known etiology.   Suspect etiology multifactorial.  Lab work in 2020 was unrevealing.  Patient was not anemic although pleased to hear menorrhagia has improved on birth control with Dr. [name].  Mammogram , colonoscopy up-to-date.  We did discuss potential for sleep apnea testing.  We will discuss this at follow-up.  Pending labs.      Left leg numbness    Exam limited due to nature of telephone visit.No alarm symptoms reported.  Patient will come in office for follow-up in person.  Pending x-ray.  Patient  declines any medication at this time such as gabapentin.    Close follow up.          Of note, requesting records regarding colonoscopy and when patient due for repeat.    I spent 21 min  discussing plan of care over the phone.

## 2019-11-27 NOTE — Assessment & Plan Note (Signed)
Chronic. No known etiology.   Suspect etiology multifactorial.  Lab work in 2020 was unrevealing.  Patient was not anemic although pleased to hear menorrhagia has improved on birth control with Dr. [name].  Mammogram , colonoscopy up-to-date.  We did discuss potential for sleep apnea testing.  We will discuss this at follow-up.  Pending labs.

## 2019-11-27 NOTE — Telephone Encounter (Signed)
I spoke with patient to triage & get more info. She stated that her leg just the outer part of the left leg is numb & tingling. She said that there is no pain & it does nit run down her calve or foot. 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 She also had had fluid drained off of her knee a few times at ortho.  The sluggishness she feels is not related to leg. She does not know if her iron may be low again. She has a hx of very heavy periods, but was put on [facility] by Dr. [name] & has now completed. She has not had a period in 2.5 months now & did have a very heavy one last one she did have.   Patient scheduled 4/30.

## 2019-11-27 NOTE — Assessment & Plan Note (Signed)
Exam limited due to nature of telephone visit.No alarm symptoms reported.  Patient will come in office for follow-up in person.  Pending x-ray.  Patient  declines any medication at this time such as gabapentin.    Close follow up.

## 2019-11-27 NOTE — Telephone Encounter (Signed)
Worked in for acute telephone visit

## 2019-11-27 NOTE — Patient Instructions (Signed)
We will do labs, x-rays when you come in for your office visit   Please let me know of any concerns

## 2019-11-27 NOTE — Telephone Encounter (Signed)
Pt called in and said she has been sluggish but her left leg has been tingling and numb for about 3 weeks. She said her foot is fine. I scheduled her for 12/07/18 @ 1:30pm.

## 2019-12-07 ENCOUNTER — Ambulatory Visit: Payer: BC Managed Care – PPO

## 2019-12-07 ENCOUNTER — Other Ambulatory Visit: Payer: Self-pay

## 2019-12-07 ENCOUNTER — Encounter: Payer: Self-pay | Admitting: Family

## 2019-12-07 ENCOUNTER — Ambulatory Visit: Payer: BC Managed Care – PPO | Admitting: Family

## 2019-12-07 VITALS — BP 112/70 | HR 86 | Temp 97.7°F | Ht 71.0 in | Wt 250.2 lb

## 2019-12-07 DIAGNOSIS — R5382 Chronic fatigue, unspecified: Secondary | ICD-10-CM | POA: Diagnosis not present

## 2019-12-07 DIAGNOSIS — R200 Anesthesia of skin: Secondary | ICD-10-CM | POA: Diagnosis not present

## 2019-12-07 NOTE — Assessment & Plan Note (Addendum)
Unchanged.  We agreed weight gain may be contributory. benign exam.  Pending x-rays, lab evaluation.  Patient would like to start here prior to referral to orthopedics

## 2019-12-07 NOTE — Progress Notes (Signed)
Subjective:    Patient ID: Brianna Wagner, female    DOB: Sep 26, 1965, 54 y.o.   MRN: [id number]  CC: Brianna Wagner is a 54 y.o. female who presents today for an acute visit.    HPI: Fatigue is unchanged. Comes and goes.  Sleeping well. No depression, si/hi. Some snoring.  Left lateral tingling x5 weeks, comes and goes.  'it has its moments.' No groin pain.  No falls. No rash, HA, vision changes. No trouble urinating, having a bowl movement.  No pain or swelling in leg, sob, cp, palpitations. No knee pain.    Weight gain of 14 pounds since last visit.  Feels activity hasnt 'stopped' ,primarily gets activity with [employer]. No changes to eating habits.       HISTORY:  Past Medical History:  Diagnosis Date  . Frequent headaches   . Hypertension    Past Surgical History:  Procedure Laterality Date  . TOOTH EXTRACTION    . WISDOM TOOTH EXTRACTION     Family History  Problem Relation Age of Onset  . Cancer Mother 15       ?unsure if pancreatic cancer.   . Hypertension Mother   . Stroke Mother   . Thyroid disease Mother   . Heart disease Mother   . Cancer Father   . Heart attack Father        71 years of age  . Melanoma Father        89 years  . Stomach cancer Father   . Heart disease Father   . Cancer Sister 75       breast  . Thyroid disease Sister   . Thyroid disease Maternal Aunt   . Breast cancer Sister 73    Allergies: Desogestrel-ethinyl estradiol and Penicillins No current outpatient medications on file prior to visit.   No current facility-administered medications on file prior to visit.    Social History   Tobacco Use  . Smoking status: Never Smoker  . Smokeless tobacco: Never Used  Substance Use Topics  . Alcohol use: Yes    Alcohol/week: 8.0 standard drinks    Types: 8 Cans of beer per week    Comment: 2-3 beers per week, approx 2-3 occasions. Social  . Drug use: No    Review of Systems  Constitutional: Negative for chills and fever.    Respiratory: Negative for cough and shortness of breath.   Cardiovascular: Negative for chest pain, palpitations and leg swelling.  Gastrointestinal: Negative for nausea and vomiting.  Genitourinary: Negative for dysuria and vaginal pain.  Musculoskeletal: Negative for back pain, gait problem, joint swelling, myalgias, neck pain and neck stiffness.  Psychiatric/Behavioral: Negative for suicidal ideas.      Objective:    BP 112/70   Pulse 86   Temp 97.7 F (36.5 C)   Ht 5\' 11"  (1.803 m)   Wt 250 lb 3.2 oz (113.5 kg)   LMP 10/07/2019 (Approximate)   SpO2 99%   BMI 34.90 kg/m   Wt Readings from Last 3 Encounters:  12/07/19 250 lb 3.2 oz (113.5 kg)  11/27/19 236 lb (107 kg)  06/05/19 236 lb 12.8 oz (107.4 kg)    Physical Exam Vitals reviewed.  Constitutional:      Appearance: She is well-developed.  Eyes:     Conjunctiva/sclera: Conjunctivae normal.  Cardiovascular:     Rate and Rhythm: Normal rate and regular rhythm.     Pulses: Normal pulses.     Heart sounds: Normal heart sounds.  Comments: No LE edema, palpable cords or masses. No erythema or increased warmth. No asymmetry in calf size when compared bilaterally LE hair growth symmetric and present. LE warm   Pulmonary:     Effort: Pulmonary effort is normal.     Breath sounds: Normal breath sounds. No wheezing, rhonchi or rales.  Musculoskeletal:     Lumbar back: No swelling, edema, spasms, tenderness or bony tenderness. Normal range of motion.     Right lower leg: No edema.     Left lower leg: No edema.     Comments: Full range of motion with flexion, tension, lateral side bends. No bony tenderness. No pain, numbness, tingling elicited with single leg raise bilaterally.  No pain over SI joint, or pain with deep palpation of the left thigh.  No edema, masses appreciated  Skin:    General: Skin is warm and dry.  Neurological:     Mental Status: She is alert.     Sensory: No sensory deficit.     Deep Tendon  Reflexes:     Reflex Scores:      Patellar reflexes are 2+ on the right side and 2+ on the left side.    Comments: Sensation and strength intact bilateral lower extremities.  Psychiatric:        Speech: Speech normal.        Behavior: Behavior normal.        Thought Content: Thought content normal.        Assessment & Plan:   Problem List Items Addressed This Visit      Other   Fatigue - Primary    Suspect multifactorial as we discussed in the past.  Pending evaluation today.  Discussed referral to pulmonology for sleep apnea evaluation, will await lab results      Relevant Orders   Celiac Disease Ab Screen w/Rfx   B12 and Folate Panel   CBC with Differential/Platelet   Celiac Disease Comprehensive Panel with Reflexes   Comprehensive metabolic panel   Hemoglobin A1c   Iron, TIBC and Ferritin Panel   Lipid panel   TSH   Urinalysis, Routine w reflex microscopic   VITAMIN D 25 Hydroxy (Vit-D Deficiency, Fractures)   Left leg numbness    Unchanged.  We agreed weight gain may be contributory. benign exam.  Pending x-rays, lab evaluation.  Patient would like to start here prior to referral to orthopedics      Relevant Orders   DG FEMUR MIN 2 VIEWS LEFT   DG Lumbar Spine Complete   Celiac Disease Comprehensive Panel with Reflexes   Iron, TIBC and Ferritin Panel       I have discontinued Yeny Detore's multivitamin and omeprazole.   No orders of the defined types were placed in this encounter.   Return precautions given.   Risks, benefits, and alternatives of the medications and treatment plan prescribed today were discussed, and patient expressed understanding.   Education regarding symptom management and diagnosis given to patient on AVS.  Continue to follow with Burnard Hawthorne, FNP for routine health maintenance.   Merlene Morse and I agreed with plan.   Mable Paris, FNP

## 2019-12-07 NOTE — Assessment & Plan Note (Signed)
Suspect multifactorial as we discussed in the past.  Pending evaluation today.  Discussed referral to pulmonology for sleep apnea evaluation, will await lab results

## 2019-12-07 NOTE — Patient Instructions (Signed)
Consider referral to [facility] for weightloss.  Below can be a helpful guide-  This is  Dr. Melina Schools  example of a  "Low GI"  Diet:  It will allow you to lose 4 to 8  lbs  per month if you follow it carefully.  Your goal with exercise is a minimum of 30 minutes of aerobic exercise 5 days per week (Walking does not count once it becomes easy!)    All of the foods can be found at grocery stores and in bulk at [employer].  The Atkins protein bars and shakes are available in more varieties at Target, WalMart and Lowe's Foods.     7 AM Breakfast:  Choose from the following:  Low carbohydrate Protein  Shakes (I recommend the  Premier Protein chocolate shakes,  EAS AdvantEdge "Carb Control" shakes  Or the Atkins shakes all are under 3 net carbs)     a scrambled egg/bacon/cheese burrito made with Mission's "carb balance" whole wheat tortilla  (about 10 net carbs )  Medical laboratory scientific officer (basically a quiche without the pastry crust) that is eaten cold and very convenient way to get your eggs.  8 carbs)  If you make your own protein shakes, avoid bananas and pineapple,  And use low carb greek yogurt or original /unsweetened almond or soy milk    Avoid cereal and bananas, oatmeal and cream of wheat and grits. They are loaded with carbohydrates!   10 AM: high protein snack:  Protein bar by Atkins (the snack size, under 200 cal, usually < 6 net carbs).    A stick of cheese:  Around 1 carb,  100 cal     Dannon Light n Fit [place] Yogurt  (80 cal, 8 carbs)  Other so called "protein bars" and Greek yogurts tend to be loaded with carbohydrates.  Remember, in food advertising, the word "energy" is synonymous for " carbohydrate."  Lunch:   A Sandwich using the bread choices listed, Can use any  Eggs,  lunchmeat, grilled meat or canned tuna), avocado, regular mayo/mustard  and cheese.  A Salad using blue cheese, ranch,  Goddess or vinagrette,  Avoid taco shells, croutons or "confetti"  and no "candied nuts" but regular nuts OK.   No pretzels, nabs  or chips.  Pickles and miniature sweet peppers are a good low carb alternative that provide a "crunch"  The bread is the only source of carbohydrate in a sandwich and  can be decreased by trying some of the attached alternatives to traditional loaf bread   Avoid "Low fat dressings, as well as Reyne Dumas and [employer] dressings They are loaded with sugar!   3 PM/ Mid day  Snack:  Consider  1 ounce of  almonds, walnuts, pistachios, pecans, peanuts,  Macadamia nuts or a nut medley.  Avoid "granola and granola bars "  Mixed nuts are ok in moderation as long as there are no raisins,  cranberries or dried fruit.   KIND bars are OK if you get the low glycemic index variety   Try the prosciutto/mozzarella cheese sticks by Fiorruci  In deli /backery section   High protein      6 PM  Dinner:     Meat/fowl/fish with a green salad, and either broccoli, cauliflower, green beans, spinach, brussel sprouts or  Lima beans. DO NOT BREAD THE PROTEIN!!      There is a low carb pasta by Dreamfield's that is acceptable and tastes great: only  5 digestible carbs/serving.( All grocery stores but BJs carry it ) Several ready made meals are available low carb:   Try Michel Angelo's chicken piccata or chicken or eggplant parm over low carb pasta.(Lowes and BJs)   Marjory Lies Sanchez's "Carnitas" (pulled pork, no sauce,  0 carbs) or his beef pot roast to make a dinner burrito (at BJ's)  Pesto over low carb pasta (bj's sells a good quality pesto in the center refrigerated section of the deli   Try satueeing  Cheral Marker with mushroooms as a good side   Green Giant makes a mashed cauliflower that tastes like mashed potatoes  Whole wheat pasta is still full of digestible carbs and  Not as low in glycemic index as Dreamfield's.   Brown rice is still rice,  So skip the rice and noodles if you eat [place] or [place] (or at least limit to 1/2 cup)  9 PM snack :    Breyer's "low carb" fudgsicle or  ice cream bar (Carb Smart line), or  Weight Watcher's ice cream bar , or another "no sugar added" ice cream;  a serving of fresh berries/cherries with whipped cream   Cheese or DANNON'S LlGHT N FIT GREEK YOGURT  8 ounces of [place] unsweetened almond/cococunut milk    Treat yourself to a parfait made with whipped cream blueberiies, walnuts and vanilla greek yogurt  Avoid bananas, pineapple, grapes  and watermelon on a regular basis because they are high in sugar.  THINK OF THEM AS DESSERT  Remember that snack Substitutions should be less than 10 NET carbs per serving and meals < 20 carbs. Remember to subtract fiber grams to get the "net carbs."  @TULLOBREADPACKAGE @

## 2019-12-10 ENCOUNTER — Telehealth: Payer: Self-pay

## 2019-12-10 LAB — CBC WITH DIFFERENTIAL/PLATELET
Absolute Monocytes: 521 cells/uL (ref 200–950)
Basophils Absolute: 68 cells/uL (ref 0–200)
Basophils Relative: 1.1 %
Eosinophils Absolute: 161 cells/uL (ref 15–500)
Eosinophils Relative: 2.6 %
HCT: 38.3 % (ref 35.0–45.0)
Hemoglobin: 12.4 g/dL (ref 11.7–15.5)
Lymphs Abs: 1525 cells/uL (ref 850–3900)
MCH: 29.3 pg (ref 27.0–33.0)
MCHC: 32.4 g/dL (ref 32.0–36.0)
MCV: 90.5 fL (ref 80.0–100.0)
MPV: 11.1 fL (ref 7.5–12.5)
Monocytes Relative: 8.4 %
Neutro Abs: 3925 cells/uL (ref 1500–7800)
Neutrophils Relative %: 63.3 %
Platelets: 285 10*3/uL (ref 140–400)
RBC: 4.23 10*6/uL (ref 3.80–5.10)
RDW: 12.7 % (ref 11.0–15.0)
Total Lymphocyte: 24.6 %
WBC: 6.2 10*3/uL (ref 3.8–10.8)

## 2019-12-10 LAB — CELIAC DISEASE COMPREHENSIVE PANEL WITH REFLEXES
(tTG) Ab, IgA: 1 U/mL
Immunoglobulin A: 260 mg/dL (ref 47–310)

## 2019-12-10 LAB — URINALYSIS, ROUTINE W REFLEX MICROSCOPIC
Bacteria, UA: NONE SEEN /HPF
Bilirubin Urine: NEGATIVE
Glucose, UA: NEGATIVE
Hgb urine dipstick: NEGATIVE
Hyaline Cast: NONE SEEN /LPF
Ketones, ur: NEGATIVE
Nitrite: NEGATIVE
Protein, ur: NEGATIVE
RBC / HPF: NONE SEEN /HPF (ref 0–2)
Specific Gravity, Urine: 1.016 (ref 1.001–1.03)
pH: <=5 (ref 5.0–8.0)

## 2019-12-10 LAB — COMPREHENSIVE METABOLIC PANEL
AG Ratio: 1.7 (calc) (ref 1.0–2.5)
ALT: 11 U/L (ref 6–29)
AST: 17 U/L (ref 10–35)
Albumin: 4 g/dL (ref 3.6–5.1)
Alkaline phosphatase (APISO): 60 U/L (ref 37–153)
BUN: 13 mg/dL (ref 7–25)
CO2: 23 mmol/L (ref 20–32)
Calcium: 9 mg/dL (ref 8.6–10.4)
Chloride: 104 mmol/L (ref 98–110)
Creat: 0.91 mg/dL (ref 0.50–1.05)
Globulin: 2.4 g/dL (calc) (ref 1.9–3.7)
Glucose, Bld: 141 mg/dL — ABNORMAL HIGH (ref 65–99)
Potassium: 3.6 mmol/L (ref 3.5–5.3)
Sodium: 139 mmol/L (ref 135–146)
Total Bilirubin: 0.5 mg/dL (ref 0.2–1.2)
Total Protein: 6.4 g/dL (ref 6.1–8.1)

## 2019-12-10 LAB — LIPID PANEL
Cholesterol: 132 mg/dL (ref <200)
HDL: 52 mg/dL (ref >=50)
LDL Cholesterol (Calc): 60 mg/dL (calc)
Non-HDL Cholesterol (Calc): 80 mg/dL (calc) (ref <130)
Total CHOL/HDL Ratio: 2.5 (calc) (ref <5.0)
Triglycerides: 118 mg/dL (ref <150)

## 2019-12-10 LAB — TSH: TSH: 1.63 mIU/L

## 2019-12-10 LAB — HEMOGLOBIN A1C
Hgb A1c MFr Bld: 4.9 % of total Hgb (ref <5.7)
Mean Plasma Glucose: 94 (calc)
eAG (mmol/L): 5.2 (calc)

## 2019-12-10 LAB — IRON,TIBC AND FERRITIN PANEL
%SAT: 24 % (calc) (ref 16–45)
Ferritin: 14 ng/mL — ABNORMAL LOW (ref 16–232)
Iron: 98 ug/dL (ref 45–160)
TIBC: 417 mcg/dL (calc) (ref 250–450)

## 2019-12-10 LAB — VITAMIN D 25 HYDROXY (VIT D DEFICIENCY, FRACTURES): Vit D, 25-Hydroxy: 42 ng/mL (ref 30–100)

## 2019-12-10 LAB — B12 AND FOLATE PANEL
Folate: 15.3 ng/mL
Vitamin B-12: 1097 pg/mL (ref 200–1100)

## 2019-12-10 NOTE — Telephone Encounter (Signed)
LMTCB for lab results.  

## 2019-12-11 ENCOUNTER — Other Ambulatory Visit: Payer: Self-pay

## 2019-12-11 ENCOUNTER — Ambulatory Visit (INDEPENDENT_AMBULATORY_CARE_PROVIDER_SITE_OTHER): Payer: BC Managed Care – PPO

## 2019-12-11 ENCOUNTER — Other Ambulatory Visit: Payer: BC Managed Care – PPO

## 2019-12-11 DIAGNOSIS — R200 Anesthesia of skin: Secondary | ICD-10-CM | POA: Diagnosis not present

## 2019-12-17 NOTE — Progress Notes (Signed)
I called & spoke with Meghan from [facility] & she is faxing over report.

## 2019-12-25 ENCOUNTER — Telehealth: Payer: Self-pay | Admitting: Family

## 2019-12-25 NOTE — Telephone Encounter (Signed)
close

## 2020-02-01 ENCOUNTER — Other Ambulatory Visit (INDEPENDENT_AMBULATORY_CARE_PROVIDER_SITE_OTHER): Payer: BC Managed Care – PPO

## 2020-02-01 ENCOUNTER — Telehealth: Payer: Self-pay | Admitting: *Deleted

## 2020-02-01 ENCOUNTER — Other Ambulatory Visit: Payer: Self-pay

## 2020-02-01 DIAGNOSIS — R5382 Chronic fatigue, unspecified: Secondary | ICD-10-CM

## 2020-02-01 DIAGNOSIS — R8290 Unspecified abnormal findings in urine: Secondary | ICD-10-CM

## 2020-02-01 NOTE — Addendum Note (Signed)
Addended by: Warden Fillers on: 02/01/2020 08:35 AM   Modules accepted: Orders

## 2020-02-01 NOTE — Telephone Encounter (Signed)
Ordered urine via [facility]

## 2020-02-01 NOTE — Telephone Encounter (Signed)
1) Pt was placed on the lab schedule with the appt note stating "labs", however, pt states she is only here to repeat a urine. So I reordered the labs for later.  2) There is no order in for a urine. Only labs.  3) Please make a note that pt always needs here labs ordered for [facility]  4) Please place future "[facility]" order for urine  Thanks

## 2020-02-01 NOTE — Addendum Note (Signed)
Addended by: Warden Fillers on: 02/01/2020 10:11 AM   Modules accepted: Orders

## 2020-02-01 NOTE — Addendum Note (Signed)
Addended by: Warden Fillers on: 02/01/2020 08:44 AM   Modules accepted: Orders

## 2020-02-02 LAB — URINALYSIS, ROUTINE W REFLEX MICROSCOPIC
Bilirubin Urine: NEGATIVE
Glucose, UA: NEGATIVE
Hgb urine dipstick: NEGATIVE
Ketones, ur: NEGATIVE
Leukocytes,Ua: NEGATIVE
Nitrite: NEGATIVE
Protein, ur: NEGATIVE
Specific Gravity, Urine: 1.014 (ref 1.001–1.03)
pH: 6 (ref 5.0–8.0)

## 2020-02-04 ENCOUNTER — Telehealth: Payer: Self-pay | Admitting: Family

## 2020-02-04 ENCOUNTER — Encounter: Payer: Self-pay | Admitting: Family

## 2020-02-04 NOTE — Telephone Encounter (Signed)
Call and spoke with patient regarding mychart message  Discussed thyroid nodules and patient never had US thyroid in 2020 with dr Lucianne Muss. Provided phone number and she will call to schedule appt as she is established patient  She declines further work up for fatigue including sleep study, referral to heme/onc.   She will see Lucianne Muss and then follow up with me.   All questions answered

## 2020-02-08 NOTE — Telephone Encounter (Signed)
Called patient and scheduled appointment on 03/07/20. Patient states she has an appointment on 02/14/20 with endocrine.

## 2020-02-14 ENCOUNTER — Ambulatory Visit
Admission: RE | Admit: 2020-02-14 | Discharge: 2020-02-14 | Disposition: A | Discharge status: Home / Self Care | Payer: BC Managed Care – PPO | Source: Ambulatory Visit | Attending: Endocrinology | Admitting: Endocrinology

## 2020-02-14 DIAGNOSIS — E041 Nontoxic single thyroid nodule: Secondary | ICD-10-CM

## 2020-02-18 ENCOUNTER — Other Ambulatory Visit: Payer: BC Managed Care – PPO

## 2020-03-07 ENCOUNTER — Other Ambulatory Visit: Payer: Self-pay

## 2020-03-07 ENCOUNTER — Ambulatory Visit: Payer: BC Managed Care – PPO | Admitting: Family

## 2020-03-07 VITALS — BP 130/90 | HR 71 | Temp 98.0°F | Ht 70.98 in | Wt 248.6 lb

## 2020-03-07 DIAGNOSIS — I10 Essential (primary) hypertension: Secondary | ICD-10-CM

## 2020-03-07 DIAGNOSIS — R5382 Chronic fatigue, unspecified: Secondary | ICD-10-CM

## 2020-03-07 DIAGNOSIS — E663 Overweight: Secondary | ICD-10-CM | POA: Diagnosis not present

## 2020-03-07 MED ORDER — BUPROPION HCL ER (XL) 150 MG PO TB24: ORAL_TABLET | ORAL | 3 refills | Status: DC

## 2020-03-07 NOTE — Progress Notes (Signed)
Subjective:    Patient ID: Brianna Wagner, female    DOB: Oct 12, 1965, 54 y.o.   MRN: [id number]  CC: Brianna Wagner is a 54 y.o. female who presents today for follow up.   HPI: Feels well today No new complaints  Fatigue- unchanged. This weight gain , being '40 pounds overweight' is contributory. Had tried topamax in the past without much weight loss. Gained 18 lbs in last year. Thinks less active as left knee is painful contributory. Drinks half sweet tea. Eats lots of salad.   No cp, night sweats, unintentional weight loss.   Snores and wakes herself snoring. Doesn't wake up feeling rested.   No h/o anorexia, bulimia, seizure.  Drinks 2-3 beers couple of times week when out with friends.   Left lateral leg pain unchanged.   Follows with Dr Despina Hick [facility]; recently had fluid taken off knee.   HTN- had been on HCTZ in the past. No cp, sob.   No si/hi No h/o suicide ideation or suicide attempts    has seen Dr Lucianne Muss for thyroid nodule and has follow up in 3 days.   Lumbar and femur xray  12/2019  Up-to-date mammogram, Pap smear and colonoscopy HISTORY:  Past Medical History:  Diagnosis Date  . Frequent headaches   . Hypertension    Past Surgical History:  Procedure Laterality Date  . TOOTH EXTRACTION    . WISDOM TOOTH EXTRACTION     Family History  Problem Relation Age of Onset  . Cancer Mother 35       ?unsure if pancreatic cancer.   . Hypertension Mother   . Stroke Mother   . Thyroid disease Mother   . Heart disease Mother   . Cancer Father   . Heart attack Father        62 years of age  . Melanoma Father        89 years  . Stomach cancer Father   . Heart disease Father   . Cancer Sister 10       breast  . Thyroid disease Sister   . Thyroid disease Maternal Aunt   . Breast cancer Sister 39    Allergies: Desogestrel-ethinyl estradiol and Penicillins No current outpatient medications on file prior to visit.   No current  facility-administered medications on file prior to visit.    Social History   Tobacco Use  . Smoking status: Never Smoker  . Smokeless tobacco: Never Used  Vaping Use  . Vaping Use: Never used  Substance Use Topics  . Alcohol use: Yes    Alcohol/week: 8.0 standard drinks    Types: 8 Cans of beer per week    Comment: 2-3 beers per week, approx 2-3 occasions. Social  . Drug use: No    Review of Systems  Constitutional: Positive for fatigue. Negative for chills, fever and unexpected weight change.  Respiratory: Negative for cough and shortness of breath.   Cardiovascular: Negative for chest pain and palpitations.  Gastrointestinal: Negative for nausea and vomiting.  Psychiatric/Behavioral: Positive for sleep disturbance.      Objective:    BP (!) 130/90   Pulse 71   Temp 98 F (36.7 C)   Ht 5' 10.98" (1.803 m)   Wt (!) 248 lb 9.6 oz (112.8 kg)   SpO2 99%   BMI 34.69 kg/m  BP Readings from Last 3 Encounters:  03/07/20 (!) 130/90  12/07/19 112/70  06/05/19 139/85   Wt Readings from Last 3 Encounters:  03/07/20 Marland Kitchen)  248 lb 9.6 oz (112.8 kg)  12/07/19 250 lb 3.2 oz (113.5 kg)  11/27/19 236 lb (107 kg)    Physical Exam Vitals reviewed.  Constitutional:      Appearance: She is well-developed.  Eyes:     Conjunctiva/sclera: Conjunctivae normal.  Cardiovascular:     Rate and Rhythm: Normal rate and regular rhythm.     Pulses: Normal pulses.     Heart sounds: Normal heart sounds.  Pulmonary:     Effort: Pulmonary effort is normal.     Breath sounds: Normal breath sounds. No wheezing, rhonchi or rales.  Skin:    General: Skin is warm and dry.  Neurological:     Mental Status: She is alert.  Psychiatric:        Speech: Speech normal.        Behavior: Behavior normal.        Thought Content: Thought content normal.        Assessment & Plan:   Problem List Items Addressed This Visit      Cardiovascular and Mediastinum   HTN (hypertension)    Elevated  today.  Patient will keep a blood pressure log at home.  Last visit had been well controlled.  Will support patient in losing weight and closely monitor blood pressure.        Other   Fatigue - Primary    Unchanged.  Suspect multifactorial including weight gain, inactivity. I do suspect underlying sleep apnea may be contributory.  Refer to pulmonology.  Close follow-up.      Relevant Orders   Ambulatory referral to Pulmonology   Overweight    No contraindications to starting Wellbutrin at this time.  She has been counseled that she may not drink any more than 1 alcoholic beverage in one sitting on wellbutrin.  She very much understands this.  She also would like to decrease her drinking so that she can lose weight.  Close follow-up.      Relevant Medications   buPROPion (WELLBUTRIN XL) 150 MG 24 hr tablet       I am having Brianna Wagner start on buPROPion.   Meds ordered this encounter  Medications  . buPROPion (WELLBUTRIN XL) 150 MG 24 hr tablet    Sig: Start 150 mg ER PO qam, increase after 3 days to 300 mg qam.    Dispense:  120 tablet    Refill:  3    Order Specific Question:   Supervising Provider    Answer:   Sherlene Shams [[id number]]    Return precautions given.   Risks, benefits, and alternatives of the medications and treatment plan prescribed today were discussed, and patient expressed understanding.   Education regarding symptom management and diagnosis given to patient on AVS.  Continue to follow with [name], FNP for routine health maintenance.   Brianna Wagner and I agreed with plan.   Rennie Plowman, FNP

## 2020-03-07 NOTE — Assessment & Plan Note (Signed)
Unchanged.  Suspect multifactorial including weight gain, inactivity. I do suspect underlying sleep apnea may be contributory.  Refer to pulmonology.  Close follow-up.

## 2020-03-07 NOTE — Patient Instructions (Addendum)
Referral to pulmonology for sleep study testing Let us know if you dont hear back within a week in regards to an appointment being scheduled.    Trial of wellbutrin.   NO More than one beer in a sitting We discussed today starting medication called Wellbutrin.  As also discussed, you must limit alcohol on this medication as alcohol and Wellbutrin together may increase your risk for seizure.  You may drink no more than 1 alcoholic beverage on this medication.  A standard drink is 12 ounces of regular beer, which is usually about 5% alcohol OR 5 ounces of wine, which is typically about 12% alcohol OR   1.5 ounces of distilled spirits, which is about 40% alcohol   This is  Dr. Melina Schools  example of a  "Low GI"  Diet:  It will allow you to lose 4 to 8  lbs  per month if you follow it carefully.  Your goal with exercise is a minimum of 30 minutes of aerobic exercise 5 days per week (Walking does not count once it becomes easy!)    All of the foods can be found at grocery stores and in bulk at [employer].  The Atkins protein bars and shakes are available in more varieties at Target, [employer] and [employer].     7 AM Breakfast:  Choose from the following:  Low carbohydrate Protein  Shakes (I recommend the  Premier Protein chocolate shakes,  EAS AdvantEdge "Carb Control" shakes  Or the Atkins shakes all are under 3 net carbs)     a scrambled egg/bacon/cheese burrito made with Mission's "carb balance" whole wheat tortilla  (about 10 net carbs )  Medical laboratory scientific officer (basically a quiche without the pastry crust) that is eaten cold and very convenient way to get your eggs.  8 carbs)  If you make your own protein shakes, avoid bananas and pineapple,  And use low carb greek yogurt or original /unsweetened almond or soy milk    Avoid cereal and bananas, oatmeal and cream of wheat and grits. They are loaded with carbohydrates!   10 AM: high protein snack:  Protein bar by Atkins (the  snack size, under 200 cal, usually < 6 net carbs).    A stick of cheese:  Around 1 carb,  100 cal     Dannon Light n Fit [place] Yogurt  (80 cal, 8 carbs)  Other so called "protein bars" and Greek yogurts tend to be loaded with carbohydrates.  Remember, in food advertising, the word "energy" is synonymous for " carbohydrate."  Lunch:   A Sandwich using the bread choices listed, Can use any  Eggs,  lunchmeat, grilled meat or canned tuna), avocado, regular mayo/mustard  and cheese.  A Salad using blue cheese, ranch,  Goddess or vinagrette,  Avoid taco shells, croutons or "confetti" and no "candied nuts" but regular nuts OK.   No pretzels, nabs  or chips.  Pickles and miniature sweet peppers are a good low carb alternative that provide a "crunch"  The bread is the only source of carbohydrate in a sandwich and  can be decreased by trying some of the attached alternatives to traditional loaf bread   Avoid "Low fat dressings, as well as Reyne Dumas and [employer] dressings They are loaded with sugar!   3 PM/ Mid day  Snack:  Consider  1 ounce of  almonds, walnuts, pistachios, pecans, peanuts,  Macadamia nuts or a nut medley.  Avoid "granola and granola  bars "  Mixed nuts are ok in moderation as long as there are no raisins,  cranberries or dried fruit.   KIND bars are OK if you get the low glycemic index variety   Try the prosciutto/mozzarella cheese sticks by Fiorruci  In deli /backery section   High protein      6 PM  Dinner:     Meat/fowl/fish with a green salad, and either broccoli, cauliflower, green beans, spinach, brussel sprouts or  Lima beans. DO NOT BREAD THE PROTEIN!!      There is a low carb pasta by Dreamfield's that is acceptable and tastes great: only 5 digestible carbs/serving.( All grocery stores but BJs carry it ) Several ready made meals are available low carb:   Try Michel Angelo's chicken piccata or chicken or eggplant parm over low carb pasta.(Lowes and BJs)   Clifton Custard  Sanchez's "Carnitas" (pulled pork, no sauce,  0 carbs) or his beef pot roast to make a dinner burrito (at [employer]J's)  Pesto over low carb pasta (bj's sells a good quality pesto in the center refrigerated section of the deli   Try satueeing  Roosvelt Harps with mushroooms as a good side   [employer] Giant makes a mashed cauliflower that tastes like mashed potatoes  Whole wheat pasta is still full of digestible carbs and  Not as low in glycemic index as Dreamfield's.   Brown rice is still rice,  So skip the rice and noodles if you eat [place] or [place] (or at least limit to 1/2 cup)  9 PM snack :   Breyer's "low carb" fudgsicle or  ice cream bar (Carb Smart line), or  Weight Watcher's ice cream bar , or another "no sugar added" ice cream;  a serving of fresh berries/cherries with whipped cream   Cheese or DANNON'S LlGHT N FIT GREEK YOGURT  8 ounces of [place] unsweetened almond/cococunut milk    Treat yourself to a parfait made with whipped cream blueberiies, walnuts and vanilla greek yogurt  Avoid bananas, pineapple, grapes  and watermelon on a regular basis because they are high in sugar.  THINK OF THEM AS DESSERT  Remember that snack Substitutions should be less than 10 NET carbs per serving and meals < 20 carbs. Remember to subtract fiber grams to get the "net carbs."  @TULLOBREADPACKAGE @  Bupropion tablets (Depression/Mood Disorders) What is this medicine? BUPROPION (byoo PROE pee on) is used to treat depression. This medicine may be used for other purposes; ask your health care provider or pharmacist if you have questions. COMMON BRAND NAME(S): Wellbutrin What should I tell my health care provider before I take this medicine? They need to know if you have any of these conditions:  an eating disorder, such as anorexia or bulimia  bipolar disorder or psychosis  diabetes or high blood sugar, treated with medication  glaucoma  heart disease, previous heart attack, or irregular heart  beat  head injury or brain tumor  high blood pressure  kidney or liver disease  seizures  suicidal thoughts or a previous suicide attempt  Tourette's syndrome  weight loss  an unusual or allergic reaction to bupropion, other medicines, foods, dyes, or preservatives  breast-feeding  pregnant or trying to become pregnant How should I use this medicine? Take this medicine by mouth with a glass of water. Follow the directions on the prescription label. You can take it with or without food. If it upsets your stomach, take it with food. Take your medicine at regular  intervals. Do not take your medicine more often than directed. Do not stop taking this medicine suddenly except upon the advice of your doctor. Stopping this medicine too quickly may cause serious side effects or your condition may worsen. A special MedGuide will be given to you by the pharmacist with each prescription and refill. Be sure to read this information carefully each time. Talk to your pediatrician regarding the use of this medicine in children. Special care may be needed. Overdosage: If you think you have taken too much of this medicine contact a poison control center or emergency room at once. NOTE: This medicine is only for you. Do not share this medicine with others. What if I miss a dose? If you miss a dose, take it as soon as you can. If it is less than four hours to your next dose, take only that dose and skip the missed dose. Do not take double or extra doses. What may interact with this medicine? Do not take this medicine with any of the following medications:  linezolid  MAOIs like Azilect, Carbex, Eldepryl, Marplan, Nardil, and Parnate  methylene blue (injected into a vein)  other medicines that contain bupropion like Zyban This medicine may also interact with the following medications:  alcohol  certain medicines for anxiety or sleep  certain medicines for blood pressure like metoprolol,  propranolol  certain medicines for depression or psychotic disturbances  certain medicines for HIV or AIDS like efavirenz, lopinavir, nelfinavir, ritonavir  certain medicines for irregular heart beat like propafenone, flecainide  certain medicines for Parkinson's disease like amantadine, levodopa  certain medicines for seizures like carbamazepine, phenytoin, phenobarbital  cimetidine  clopidogrel  cyclophosphamide  digoxin  furazolidone  isoniazid  nicotine  orphenadrine  procarbazine  steroid medicines like prednisone or cortisone  stimulant medicines for attention disorders, weight loss, or to stay awake  tamoxifen  theophylline  thiotepa  ticlopidine  tramadol  warfarin This list may not describe all possible interactions. Give your health care provider a list of all the medicines, herbs, non-prescription drugs, or dietary supplements you use. Also tell them if you smoke, drink alcohol, or use illegal drugs. Some items may interact with your medicine. What should I watch for while using this medicine? Tell your doctor if your symptoms do not get better or if they get worse. Visit your doctor or healthcare provider for regular checks on your progress. Because it may take several weeks to see the full effects of this medicine, it is important to continue your treatment as prescribed by your doctor. This medicine may cause serious skin reactions. They can happen weeks to months after starting the medicine. Contact your healthcare provider right away if you notice fevers or flu-like symptoms with a rash. The rash may be red or purple and then turn into blisters or peeling of the skin. Or, you might notice a red rash with swelling of the face, lips or lymph nodes in your neck or under your arms. Patients and their families should watch out for new or worsening thoughts of suicide or depression. Also watch out for sudden changes in feelings such as feeling anxious,  agitated, panicky, irritable, hostile, aggressive, impulsive, severely restless, overly excited and hyperactive, or not being able to sleep. If this happens, especially at the beginning of treatment or after a change in dose, call your healthcare provider. Avoid alcoholic drinks while taking this medicine. Drinking excessive alcoholic beverages, using sleeping or anxiety medicines, or quickly stopping the use of  these agents while taking this medicine may increase your risk for a seizure. Do not drive or use heavy machinery until you know how this medicine affects you. This medicine can impair your ability to perform these tasks. Do not take this medicine close to bedtime. It may prevent you from sleeping. Your mouth may get dry. Chewing sugarless gum or sucking hard candy, and drinking plenty of water may help. Contact your doctor if the problem does not go away or is severe. What side effects may I notice from receiving this medicine? Side effects that you should report to your doctor or health care professional as soon as possible:  allergic reactions like skin rash, itching or hives, swelling of the face, lips, or tongue  breathing problems  changes in vision  confusion  elevated mood, decreased need for sleep, racing thoughts, impulsive behavior  fast or irregular heartbeat  hallucinations, loss of contact with reality  increased blood pressure  rash, fever, and swollen lymph nodes  redness, blistering, peeling, or loosening of the skin, including inside the mouth  seizures  suicidal thoughts or other mood changes  unusually weak or tired  vomiting Side effects that usually do not require medical attention (report to your doctor or health care professional if they continue or are bothersome):  constipation  headache  loss of appetite  nausea  tremors  weight loss This list may not describe all possible side effects. Call your doctor for medical advice about side  effects. You may report side effects to FDA at 1-800-FDA-1088. Where should I keep my medicine? Keep out of the reach of children. Store at room temperature between 20 and 25 degrees C (68 and 77 degrees F), away from direct sunlight and moisture. Keep tightly closed. Throw away any unused medicine after the expiration date. NOTE: This sheet is a summary. It may not cover all possible information. If you have questions about this medicine, talk to your doctor, pharmacist, or health care provider.  2020 Elsevier/Gold Standard (2018-10-19 14:02:47)

## 2020-03-07 NOTE — Assessment & Plan Note (Signed)
No contraindications to starting Wellbutrin at this time.  She has been counseled that she may not drink any more than 1 alcoholic beverage in one sitting on wellbutrin.  She very much understands this.  She also would like to decrease her drinking so that she can lose weight.  Close follow-up.

## 2020-03-07 NOTE — Assessment & Plan Note (Signed)
Elevated today.  Patient will keep a blood pressure log at home.  Last visit had been well controlled.  Will support patient in losing weight and closely monitor blood pressure.

## 2020-03-10 ENCOUNTER — Other Ambulatory Visit: Payer: Self-pay

## 2020-03-10 ENCOUNTER — Encounter: Payer: Self-pay | Admitting: Endocrinology

## 2020-03-10 ENCOUNTER — Ambulatory Visit (INDEPENDENT_AMBULATORY_CARE_PROVIDER_SITE_OTHER): Payer: BC Managed Care – PPO | Admitting: Endocrinology

## 2020-03-10 VITALS — BP 118/70 | HR 64 | Ht 70.98 in | Wt 248.2 lb

## 2020-03-10 DIAGNOSIS — E041 Nontoxic single thyroid nodule: Secondary | ICD-10-CM | POA: Diagnosis not present

## 2020-03-10 NOTE — Progress Notes (Signed)
Patient ID: Brianna Wagner, female   DOB: 21-Jan-1966, 54 y.o.   MRN: [id number]          Reason for Appointment: Goiter, follow-up     History of Present Illness:    Prior history:  The patient's thyroid enlargement was first discovered around the year 2004 She has no clear recollection of her initial diagnosis but may have had an abnormal screening thyroid blood test Subsequently was having various thyroid imaging and was told to have a hot nodule No previous treatment with radioactive iodine or needle aspiration biopsies done  No records are available from previous records, she has seen 2 endocrinologist in the past  She saw her new PCP in 2018 when she was evaluated with thyroid levels and ultrasound was done She was then referred here for follow-up  RECENT HISTORY:  In 2019 she was recommended needle aspiration on her nodule in the isthmus However she did not go for this as recommended However has had another ultrasound done recently as recommended  She does not feel any discomfort or pressure in her neck and no difficulty swallowing   ULTRASOUND done in 7/21 shows the following  Nodule location: Right; Mid.  Adjacent to isthmus.  Maximum size: 1.3 cm; Other 2 dimensions: 1.0 x 0.9 cm, previously,1.4 cm Composition: solid/almost completely solid (2) Echogenicity: hypoechoic (2).  Shape not taller than wide  Previous ultrasound in 06/2017, the nodule adjacent to isthmus was described as follows:  Maximum size: 1.4 cm; Other 2 dimensions: 1.1 cm x 0.9 cm  Composition: solid/almost completely solid (2)  Echogenicity: isoechoic (1)  Shape: taller-than-wide (3)   Margins: smooth (0)  Echogenic foci: punctate echogenic foci (3)  ACR TI-RADS total points: 9.  She also has a 1.9 cm left inferior nodule  Thyroid levels being followed by PCP, last TSH normal   Lab Results  Component Value Date   FREET4 0.80 05/08/2019   FREET4 1.0 06/02/2017   TSH 1.63  12/07/2019   TSH 1.52 02/27/2019   TSH 1.69 06/02/2017     Allergies as of 03/10/2020      Reactions   Desogestrel-ethinyl Estradiol Other (See Comments)   fatigue   Penicillins       Medication List       Accurate as of March 10, 2020 11:14 AM. If you have any questions, ask your nurse or doctor.        buPROPion 150 MG 24 hr tablet Commonly known as: WELLBUTRIN XL Start 150 mg ER PO qam, increase after 3 days to 300 mg qam.       Allergies:  Allergies  Allergen Reactions  . Desogestrel-Ethinyl Estradiol Other (See Comments)    fatigue  . Penicillins     Past Medical History:  Diagnosis Date  . Frequent headaches   . Hypertension     Past Surgical History:  Procedure Laterality Date  . TOOTH EXTRACTION    . WISDOM TOOTH EXTRACTION      Family History  Problem Relation Age of Onset  . Cancer Mother 38       ?unsure if pancreatic cancer.   . Hypertension Mother   . Stroke Mother   . Thyroid disease Mother   . Heart disease Mother   . Cancer Father   . Heart attack Father        72 years of age  . Melanoma Father        89 years  . Stomach cancer Father   .  Heart disease Father   . Cancer Sister 77       breast  . Thyroid disease Sister   . Thyroid disease Maternal Aunt   . Breast cancer Sister 35    Social History:  reports that she has never smoked. She has never used smokeless tobacco. She reports current alcohol use of about 8.0 standard drinks of alcohol per week. She reports that she does not use drugs.    Review of Systems  She is being referred for a sleep study by her PCP  She has refused the Covid vaccine and she states that she has good immune system and is not concerned about this.  Also does not believe in vaccines   Examination:   BP 118/70 (BP Location: Right Arm, Patient Position: Sitting, Cuff Size: Large)   Pulse 64   Ht 5' 10.98" (1.803 m)   Wt (!) 248 lb 3.2 oz (112.6 kg)   SpO2 98%   BMI 34.64 kg/m   Thyroid  barely slightly on swallowing on the left side, no distinct nodule felt. Has a 1 cm somewhat mobile nodule felt medially on the right side below the cricoid cartilage, unclear whether this is part of the thyroid  Assessment/Plan:  Multinodular goiter, likely long-standing with previous imaging results not available for review May have had a hot nodule in the past However subsequently her thyroid levels have been normal  Not clear if she has a palpable nodule adjacent to the isthmus or other subcutaneous nodule as it is relatively high in position Recent ultrasound does not show any change/slightly smaller dimensions of the her right thyroid nodule No biopsy recommended at this time  Her previous history was reviewed, no other information is available in Care Everywhere Thyroid levels normal consistently  Her left-sided thyroid nodule is also stable  Most likely she has had longstanding benign small nodules with no change seen in the recent ultrasound Does not need any follow-up unless there is any clinical change in her exam She will follow up with her PCP for now  Discussed current crisis with the delta variant of the Covid virus and need for vaccine protection but she will not consider it.  Reather Littler 03/10/2020

## 2020-03-21 ENCOUNTER — Encounter: Payer: BC Managed Care – PPO | Admitting: Family

## 2020-05-05 ENCOUNTER — Ambulatory Visit (INDEPENDENT_AMBULATORY_CARE_PROVIDER_SITE_OTHER): Payer: BC Managed Care – PPO | Admitting: Primary Care

## 2020-05-05 ENCOUNTER — Other Ambulatory Visit: Payer: Self-pay

## 2020-05-05 ENCOUNTER — Encounter: Payer: Self-pay | Admitting: Primary Care

## 2020-05-05 VITALS — BP 128/80 | HR 89 | Temp 97.7°F | Ht 71.0 in | Wt 244.0 lb

## 2020-05-05 DIAGNOSIS — R0683 Snoring: Secondary | ICD-10-CM

## 2020-05-05 NOTE — Patient Instructions (Addendum)
Recommendations: - Continue to work on weight loss efforts and increasing physical exercise (start goal 10% weight- 25lbs) - Patient education for sleep apnea attached below  Orders: - Home sleep study re: snoring  Follow-up: - 6 to 8 weeks after sleep study to review results     Sleep Apnea Sleep apnea affects breathing during sleep. It causes breathing to stop for a short time or to become shallow. It can also increase the risk of:  Heart attack.  Stroke.  Being very overweight (obese).  Diabetes.  Heart failure.  Irregular heartbeat. The goal of treatment is to help you breathe normally again. What are the causes? There are three kinds of sleep apnea:  Obstructive sleep apnea. This is caused by a blocked or collapsed airway.  Central sleep apnea. This happens when the brain does not send the right signals to the muscles that control breathing.  Mixed sleep apnea. This is a combination of obstructive and central sleep apnea. The most common cause of this condition is a collapsed or blocked airway. This can happen if:  Your throat muscles are too relaxed.  Your tongue and tonsils are too large.  You are overweight.  Your airway is too small. What increases the risk?  Being overweight.  Smoking.  Having a small airway.  Being older.  Being female.  Drinking alcohol.  Taking medicines to calm yourself (sedatives or tranquilizers).  Having family members with the condition. What are the signs or symptoms?  Trouble staying asleep.  Being sleepy or tired during the day.  Getting angry a lot.  Loud snoring.  Headaches in the morning.  Not being able to focus your mind (concentrate).  Forgetting things.  Less interest in sex.  Mood swings.  Personality changes.  Feelings of sadness (depression).  Waking up a lot during the night to pee (urinate).  Dry mouth.  Sore throat. How is this diagnosed?  Your medical history.  A physical  exam.  A test that is done when you are sleeping (sleep study). The test is most often done in a sleep lab but may also be done at home. How is this treated?   Sleeping on your side.  Using a medicine to get rid of mucus in your nose (decongestant).  Avoiding the use of alcohol, medicines to help you relax, or certain pain medicines (narcotics).  Losing weight, if needed.  Changing your diet.  Not smoking.  Using a machine to open your airway while you sleep, such as: ? An oral appliance. This is a mouthpiece that shifts your lower jaw forward. ? A CPAP device. This device blows air through a mask when you breathe out (exhale). ? An EPAP device. This has valves that you put in each nostril. ? A BPAP device. This device blows air through a mask when you breathe in (inhale) and breathe out.  Having surgery if other treatments do not work. It is important to get treatment for sleep apnea. Without treatment, it can lead to:  High blood pressure.  Coronary artery disease.  In men, not being able to have an erection (impotence).  Reduced thinking ability. Follow these instructions at home: Lifestyle  Make changes that your doctor recommends.  Eat a healthy diet.  Lose weight if needed.  Avoid alcohol, medicines to help you relax, and some pain medicines.  Do not use any products that contain nicotine or tobacco, such as cigarettes, e-cigarettes, and chewing tobacco. If you need help quitting, ask your doctor.  General instructions  Take over-the-counter and prescription medicines only as told by your doctor.  If you were given a machine to use while you sleep, use it only as told by your doctor.  If you are having surgery, make sure to tell your doctor you have sleep apnea. You may need to bring your device with you.  Keep all follow-up visits as told by your doctor. This is important. Contact a doctor if:  The machine that you were given to use during sleep bothers  you or does not seem to be working.  You do not get better.  You get worse. Get help right away if:  Your chest hurts.  You have trouble breathing in enough air.  You have an uncomfortable feeling in your back, arms, or stomach.  You have trouble talking.  One side of your body feels weak.  A part of your face is hanging down. These symptoms may be an emergency. Do not wait to see if the symptoms will go away. Get medical help right away. Call your local emergency services (911 in the [place] Do not drive yourself to the hospital. Summary  This condition affects breathing during sleep.  The most common cause is a collapsed or blocked airway.  The goal of treatment is to help you breathe normally while you sleep. This information is not intended to replace advice given to you by your health care provider. Make sure you discuss any questions you have with your health care provider. Document Revised: 05/12/2018 Document Reviewed: 03/21/2018 Elsevier Patient Education  2020 [employer].

## 2020-05-05 NOTE — Progress Notes (Signed)
@Patient  ID: , female    DOB: [birth year], 54 y.o.   MRN: [id number]  Chief Complaint  Patient presents with  . Consult    Patient here for sleep consult, experiencing chronic fatigue. Denies waking up gasping for air. Patient states that she does snore.    Referring provider: [id number], FNP  HPI: 54 year old female, never smoked.  Past medical history significant for hypertension, thyroid nodule, fatigue, overweight. Patient was referred by family nurse practitioner for sleep consult.  05/05/2020 Patient presents today for sleep consult. She reports symptoms of restless sleep, excessive daytime fatigue and snoring for several years. These symptoms have become more noticeable recently. She has gained +30lbs in the past 2 years.  It takes her a few minutes to fall asleep at night. She wakes up 1-2 times to use the restroom. She has never had a sleep study or worn CPAP before. No family hx of sleep apnea.   Sleep questionnaire  Symptoms: Restless sleep, excessive daytime somnolence, snoring Epworth score: 5 Typical bedtime: In bed at10pm Waketime: Starts day at 5:30am Nocturnal awakenings: 1-2 times   Allergies  Allergen Reactions  . Desogestrel-Ethinyl Estradiol Other (See Comments)    fatigue  . Penicillins      There is no immunization history on file for this patient.  Past Medical History:  Diagnosis Date  . Frequent headaches   . Hypertension     Tobacco History: Social History   Tobacco Use  Smoking Status Never Smoker  Smokeless Tobacco Never Used   Counseling given: Not Answered   Outpatient Medications Prior to Visit  Medication Sig Dispense Refill  . buPROPion (WELLBUTRIN XL) 150 MG 24 hr tablet Start 150 mg ER PO qam, increase after 3 days to 300 mg qam. 120 tablet 3  . Multiple Vitamin (MULTIVITAMIN) tablet Take 1 tablet by mouth daily.     No facility-administered medications prior to visit.    Review of Systems  Review of  Systems  Constitutional: Positive for fatigue.  Respiratory: Negative.   Cardiovascular: Negative.   Psychiatric/Behavioral: Positive for sleep disturbance.    Physical Exam  BP 128/80 (BP Location: Left Arm, Patient Position: Sitting, Cuff Size: Normal)   Pulse 89   Temp 97.7 F (36.5 C) (Temporal)   Ht 5\' 11"  (1.803 m)   Wt 244 lb (110.7 kg)   SpO2 99%   BMI 34.03 kg/m  Physical Exam Constitutional:      Appearance: Normal appearance.  Cardiovascular:     Rate and Rhythm: Normal rate and regular rhythm.  Pulmonary:     Effort: Pulmonary effort is normal.     Breath sounds: Normal breath sounds.  Neurological:     General: No focal deficit present.     Mental Status: She is alert and oriented to person, place, and time. Mental status is at baseline.  Psychiatric:        Mood and Affect: Mood normal.        Behavior: Behavior normal.        Thought Content: Thought content normal.        Judgment: Judgment normal.      Lab Results:  CBC    Component Value Date/Time   WBC 6.2 12/07/2019 1420   RBC 4.23 12/07/2019 1420   HGB 12.4 12/07/2019 1420   HCT 38.3 12/07/2019 1420   PLT 285 12/07/2019 1420   MCV 90.5 12/07/2019 1420   MCH 29.3 12/07/2019 1420   MCHC 32.4 12/07/2019 1420  RDW 12.7 12/07/2019 1420   LYMPHSABS 1,525 12/07/2019 1420   EOSABS 161 12/07/2019 1420   BASOSABS 68 12/07/2019 1420    BMET    Component Value Date/Time   NA 139 12/07/2019 1420   K 3.6 12/07/2019 1420   CL 104 12/07/2019 1420   CO2 23 12/07/2019 1420   GLUCOSE 141 (H) 12/07/2019 1420   BUN 13 12/07/2019 1420   CREATININE 0.91 12/07/2019 1420   CALCIUM 9.0 12/07/2019 1420    BNP No results found for: BNP  ProBNP No results found for: PROBNP  Imaging: No results found.   Assessment & Plan:   Snoring - Patient reports restless sleep, snoring and daytime fatigue for several years, recently worsened with weight gain - Orders Home sleep study re: snoring -  Continue to work on weight loss efforts and increasing physical exercise (start goal 10% weight- 25lbs) - Patient education given to patient on sleep apnea  - Follow-up 6 to 8 weeks after sleep study to review results    Glenford Bayley, NP 05/19/2020

## 2020-05-19 DIAGNOSIS — G4733 Obstructive sleep apnea (adult) (pediatric): Secondary | ICD-10-CM | POA: Insufficient documentation

## 2020-05-19 DIAGNOSIS — R0683 Snoring: Secondary | ICD-10-CM | POA: Insufficient documentation

## 2020-05-19 NOTE — Assessment & Plan Note (Signed)
-   Patient reports restless sleep, snoring and daytime fatigue for several years, recently worsened with weight gain - Orders Home sleep study re: snoring - Continue to work on weight loss efforts and increasing physical exercise (start goal 10% weight- 25lbs) - Patient education given to patient on sleep apnea  - Follow-up 6 to 8 weeks after sleep study to review results

## 2020-05-30 ENCOUNTER — Encounter: Payer: Self-pay | Admitting: Family

## 2020-05-30 ENCOUNTER — Ambulatory Visit (INDEPENDENT_AMBULATORY_CARE_PROVIDER_SITE_OTHER): Payer: BC Managed Care – PPO | Admitting: Family

## 2020-05-30 ENCOUNTER — Other Ambulatory Visit: Payer: Self-pay

## 2020-05-30 DIAGNOSIS — R5382 Chronic fatigue, unspecified: Secondary | ICD-10-CM | POA: Diagnosis not present

## 2020-05-30 DIAGNOSIS — E663 Overweight: Secondary | ICD-10-CM

## 2020-05-30 DIAGNOSIS — I10 Essential (primary) hypertension: Secondary | ICD-10-CM | POA: Diagnosis not present

## 2020-05-30 NOTE — Assessment & Plan Note (Addendum)
Appetite decreased on wellbutrin. Consulted with pharmD , Brianna Wagner, whom agreed that no more than one alcoholic beverage.  I counseled patient that she is drinking too much alcohol on wellbutrin and is at risk for seizures. She can either wean off mediation or limit alcohol to no more than ONE serving per day. Patient agrees to limit alcohol to no more than one beverage per day 2-3 times per week and I have printed information in regards to ounces of a serving on her AVS for her review. She understands the risks of seizures. She will reduce alcohol intake for the additional benefit of weight loss. She will let me know how she is doing. Discussed low glycemic diet options as well. All questions answered.

## 2020-05-30 NOTE — Assessment & Plan Note (Signed)
Borderline elevated. Will focus on weight loss.

## 2020-05-30 NOTE — Progress Notes (Signed)
Subjective:    Patient ID: Brianna Wagner, female    DOB: Sep 22, 1965, 54 y.o.   MRN: [id number]  CC: Brianna Wagner is a 54 y.o. female who presents today for follow up.   HPI: Follow up Weight gain Started wellbutrin 300mg  at last visit. Sleeping well.  Fatigue 'is much better' especially noticed over the past 3 weeks.   Has noticed decreased appetite. Has cut bread out. Eating salad with grilled chicken.   Knee pain limits exercise . Drinking lots of water.   Takes multivitamin.   Drinks 2 beers 2-3 nights per week socially. She feels this is contributory to weight gain.     Pending home sleep study through pulmonology Consult with Dr 03/2020 in regards to thyroid nodules; advised no biopsy at this time and to follow with PCP  Due mammogram- declines today  HISTORY:  Past Medical History:  Diagnosis Date  . Frequent headaches   . Hypertension    Past Surgical History:  Procedure Laterality Date  . TOOTH EXTRACTION    . WISDOM TOOTH EXTRACTION     Family History  Problem Relation Age of Onset  . Cancer Mother 62       ?unsure if pancreatic cancer.   . Hypertension Mother   . Stroke Mother   . Thyroid disease Mother   . Heart disease Mother   . Cancer Father   . Heart attack Father        75 years of age  . Melanoma Father        89 years  . Stomach cancer Father   . Heart disease Father   . Cancer Sister 58       breast  . Thyroid disease Sister   . Thyroid disease Maternal Aunt   . Breast cancer Sister 10    Allergies: Desogestrel-ethinyl estradiol and Penicillins Current Outpatient Medications on File Prior to Visit  Medication Sig Dispense Refill  . buPROPion (WELLBUTRIN XL) 150 MG 24 hr tablet Start 150 mg ER PO qam, increase after 3 days to 300 mg qam. 120 tablet 3  . Multiple Vitamin (MULTIVITAMIN) tablet Take 1 tablet by mouth daily.     No current facility-administered medications on file prior to visit.    Social History   Tobacco Use    . Smoking status: Never Smoker  . Smokeless tobacco: Never Used  Vaping Use  . Vaping Use: Never used  Substance Use Topics  . Alcohol use: Yes    Alcohol/week: 8.0 standard drinks    Types: 8 Cans of beer per week    Comment: 2-3 beers per week, approx 2-3 occasions. Social  . Drug use: No    Review of Systems  Constitutional: Negative for chills and fever.  Respiratory: Negative for cough.   Cardiovascular: Negative for chest pain and palpitations.  Gastrointestinal: Negative for nausea and vomiting.  Neurological: Negative for seizures.      Objective:    BP 126/90   Pulse 68   Temp 97.9 F (36.6 C)   Ht 5\' 11"  (1.803 m)   Wt 240 lb 3.2 oz (109 kg)   SpO2 99%   BMI 33.50 kg/m  BP Readings from Last 3 Encounters:  05/30/20 126/90  05/05/20 128/80  03/10/20 118/70   Wt Readings from Last 3 Encounters:  05/30/20 240 lb 3.2 oz (109 kg)  05/05/20 244 lb (110.7 kg)  03/10/20 (!) 248 lb 3.2 oz (112.6 kg)    Physical Exam Vitals reviewed.  Constitutional:      Appearance: She is well-developed.  Eyes:     Conjunctiva/sclera: Conjunctivae normal.  Cardiovascular:     Rate and Rhythm: Normal rate and regular rhythm.     Pulses: Normal pulses.     Heart sounds: Normal heart sounds.  Pulmonary:     Effort: Pulmonary effort is normal.     Breath sounds: Normal breath sounds. No wheezing, rhonchi or rales.  Skin:    General: Skin is warm and dry.  Neurological:     Mental Status: She is alert.  Psychiatric:        Speech: Speech normal.        Behavior: Behavior normal.        Thought Content: Thought content normal.        Assessment & Plan:   Problem List Items Addressed This Visit      Cardiovascular and Mediastinum   HTN (hypertension)    Borderline elevated. Will focus on weight loss.         Other   Fatigue    Improved on wellbutrin. Pending sleep study. Declines further work up at this time. Will follow.       Overweight    Appetite  decreased on wellbutrin. Consulted with pharmD , Catie Feliz Beam, whom agreed that no more than one alcoholic beverage.  I counseled patient that she is drinking too much alcohol on wellbutrin and is at risk for seizures. She can either wean off mediation or limit alcohol to no more than ONE serving per day. Patient agrees to limit alcohol to no more than one beverage per day 2-3 times per week and I have printed information in regards to ounces of a serving on her AVS for her review. She understands the risks of seizures. She will reduce alcohol intake for the additional benefit of weight loss. She will let me know how she is doing. Discussed low glycemic diet options as well. All questions answered.           I am having Brianna Wagner maintain her buPROPion and multivitamin.   No orders of the defined types were placed in this encounter.   Return precautions given.   Risks, benefits, and alternatives of the medications and treatment plan prescribed today were discussed, and patient expressed understanding.   Education regarding symptom management and diagnosis given to patient on AVS.  Continue to follow with [name], FNP for routine health maintenance.   Brianna Wagner and I agreed with plan.   Rennie Plowman, FNP

## 2020-05-30 NOTE — Patient Instructions (Addendum)
Call me when you are ready for mammogram, this is over due since 04/2020.  As you take wellbutrin and we talked about at length and also discussed, you must limit alcohol on this medication as alcohol and Wellbutrin together may increase your risk for seizure.  You may drink no more than 1 alcoholic beverage on this medication.  A standard drink is 12 ounces of regular beer, which is usually about 5% alcohol OR 5 ounces of wine, which is typically about 12% alcohol OR   1.5 ounces of distilled spirits, which is about 40% alcohol  This is  Dr. Melina Schools  example of a  "Low GI"  Diet:  It will allow you to lose 4 to 8  lbs  per month if you follow it carefully.  Your goal with exercise is a minimum of 30 minutes of aerobic exercise 5 days per week (Walking does not count once it becomes easy!)    All of the foods can be found at grocery stores and in bulk at [employer].  The Atkins protein bars and shakes are available in more varieties at Target, WalMart and Lowe's Foods.     7 AM Breakfast:  Choose from the following:  Low carbohydrate Protein  Shakes (I recommend the  Premier Protein chocolate shakes,  EAS AdvantEdge "Carb Control" shakes  Or the Atkins shakes all are under 3 net carbs)     a scrambled egg/bacon/cheese burrito made with Mission's "carb balance" whole wheat tortilla  (about 10 net carbs )  Medical laboratory scientific officer (basically a quiche without the pastry crust) that is eaten cold and very convenient way to get your eggs.  8 carbs)  If you make your own protein shakes, avoid bananas and pineapple,  And use low carb greek yogurt or original /unsweetened almond or soy milk    Avoid cereal and bananas, oatmeal and cream of wheat and grits. They are loaded with carbohydrates!   10 AM: high protein snack:  Protein bar by Atkins (the snack size, under 200 cal, usually < 6 net carbs).    A stick of cheese:  Around 1 carb,  100 cal     Dannon Light n Fit [place] Yogurt  (80  cal, 8 carbs)  Other so called "protein bars" and Greek yogurts tend to be loaded with carbohydrates.  Remember, in food advertising, the word "energy" is synonymous for " carbohydrate."  Lunch:   A Sandwich using the bread choices listed, Can use any  Eggs,  lunchmeat, grilled meat or canned tuna), avocado, regular mayo/mustard  and cheese.  A Salad using blue cheese, ranch,  Goddess or vinagrette,  Avoid taco shells, croutons or "confetti" and no "candied nuts" but regular nuts OK.   No pretzels, nabs  or chips.  Pickles and miniature sweet peppers are a good low carb alternative that provide a "crunch"  The bread is the only source of carbohydrate in a sandwich and  can be decreased by trying some of the attached alternatives to traditional loaf bread   Avoid "Low fat dressings, as well as Reyne Dumas and [employer] Foods dressings They are loaded with sugar!   3 PM/ Mid day  Snack:  Consider  1 ounce of  almonds, walnuts, pistachios, pecans, peanuts,  Macadamia nuts or a nut medley.  Avoid "granola and granola bars "  Mixed nuts are ok in moderation as long as there are no raisins,  cranberries or dried fruit.   KIND bars  are OK if you get the low glycemic index variety   Try the prosciutto/mozzarella cheese sticks by Fiorruci  In deli /backery section   High protein      6 PM  Dinner:     Meat/fowl/fish with a green salad, and either broccoli, cauliflower, green beans, spinach, brussel sprouts or  Lima beans. DO NOT BREAD THE PROTEIN!!      There is a low carb pasta by Dreamfield's that is acceptable and tastes great: only 5 digestible carbs/serving.( All grocery stores but BJs carry it ) Several ready made meals are available low carb:   Try Michel Angelo's chicken piccata or chicken or eggplant parm over low carb pasta.(Lowes and BJs)   Clifton Custard Sanchez's "Carnitas" (pulled pork, no sauce,  0 carbs) or his beef pot roast to make a dinner burrito (at BJ's)  Pesto over low carb pasta (bj's  sells a good quality pesto in the center refrigerated section of the deli   Try satueeing  Roosvelt Harps with mushroooms as a good side   Green Giant makes a mashed cauliflower that tastes like mashed potatoes  Whole wheat pasta is still full of digestible carbs and  Not as low in glycemic index as Dreamfield's.   Brown rice is still rice,  So skip the rice and noodles if you eat [place] or [place] (or at least limit to 1/2 cup)  9 PM snack :   Breyer's "low carb" fudgsicle or  ice cream bar (Carb Smart line), or  Weight Watcher's ice cream bar , or another "no sugar added" ice cream;  a serving of fresh berries/cherries with whipped cream   Cheese or DANNON'S LlGHT N FIT GREEK YOGURT  8 ounces of [place] unsweetened almond/cococunut milk    Treat yourself to a parfait made with whipped cream blueberiies, walnuts and vanilla greek yogurt  Avoid bananas, pineapple, grapes  and watermelon on a regular basis because they are high in sugar.  THINK OF THEM AS DESSERT  Remember that snack Substitutions should be less than 10 NET carbs per serving and meals < 20 carbs. Remember to subtract fiber grams to get the "net carbs."

## 2020-05-30 NOTE — Assessment & Plan Note (Signed)
Improved on wellbutrin. Pending sleep study. Declines further work up at this time. Will follow.

## 2020-06-04 ENCOUNTER — Telehealth: Payer: Self-pay | Admitting: Primary Care

## 2020-06-04 NOTE — Telephone Encounter (Signed)
I have spoke with Brianna Wagner and she has been rescheduled to pick up HST machine at 11:30am on 06/06/20

## 2020-06-06 ENCOUNTER — Other Ambulatory Visit: Payer: Self-pay

## 2020-06-06 ENCOUNTER — Ambulatory Visit: Payer: BC Managed Care – PPO

## 2020-06-06 DIAGNOSIS — G4733 Obstructive sleep apnea (adult) (pediatric): Secondary | ICD-10-CM | POA: Diagnosis not present

## 2020-06-06 DIAGNOSIS — R0683 Snoring: Secondary | ICD-10-CM

## 2020-06-10 DIAGNOSIS — G4733 Obstructive sleep apnea (adult) (pediatric): Secondary | ICD-10-CM | POA: Diagnosis not present

## 2020-06-11 ENCOUNTER — Telehealth: Payer: Self-pay | Admitting: Family

## 2020-06-11 DIAGNOSIS — F40243 Fear of flying: Secondary | ICD-10-CM

## 2020-06-11 MED ORDER — BUSPIRONE HCL 5 MG PO TABS: 5.0000 mg | ORAL_TABLET | Freq: Three times a day (TID) | ORAL | 1 refills | Status: DC | PRN

## 2020-06-11 NOTE — Telephone Encounter (Signed)
Patient voiced understanding and is agreeable to Buspar.

## 2020-06-11 NOTE — Telephone Encounter (Signed)
Patient is leaving to travel next week and she is afraid to fly on the plane to the ship. She needs something for flying and she also needs something for the ship. 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

## 2020-06-11 NOTE — Telephone Encounter (Signed)
Call pt buspar is a great medication for anxiety I have sent in and she can take 3x per day as needed

## 2020-06-13 ENCOUNTER — Telehealth: Payer: Self-pay | Admitting: Primary Care

## 2020-06-13 NOTE — Telephone Encounter (Signed)
-----   Message from Coralyn Helling, MD sent at 06/10/2020  1:21 PM EDT ----- Home sleep study from 06/07/20 showed moderate obstructive sleep apnea with an AHI of 15.5 and SpO2 low of 82%.   Thanks.  Brianna Wagner

## 2020-06-13 NOTE — Telephone Encounter (Signed)
Attempted to call pt but unable to reach. Unable to leave VM due to mailbox being full. Will try to call back later. 

## 2020-06-13 NOTE — Telephone Encounter (Signed)
Please let patient know HST showed moderate OSA, recommend televisit to review results and treatment options

## 2020-06-17 ENCOUNTER — Encounter: Payer: Self-pay | Admitting: *Deleted

## 2020-06-17 NOTE — Telephone Encounter (Signed)
Tried calling pt again but still unable to reach. Sending letter to pt to call office as we have attempted to call pt multiple times and have not been able to reach. Encounter is also being closed.

## 2020-08-11 ENCOUNTER — Other Ambulatory Visit: Payer: Self-pay

## 2020-08-11 ENCOUNTER — Telehealth: Payer: Self-pay | Admitting: Family

## 2020-08-11 ENCOUNTER — Emergency Department
Admission: EM | Admit: 2020-08-11 | Discharge: 2020-08-11 | Disposition: A | Discharge status: Home / Self Care | Payer: BC Managed Care – PPO | Attending: Emergency Medicine | Admitting: Emergency Medicine

## 2020-08-11 ENCOUNTER — Encounter: Payer: Self-pay | Admitting: Emergency Medicine

## 2020-08-11 DIAGNOSIS — I10 Essential (primary) hypertension: Secondary | ICD-10-CM | POA: Diagnosis not present

## 2020-08-11 NOTE — ED Notes (Signed)
Patient comes in with c/o high blood pressure. Patient has been using wrist BP cuff at home and states she does not trust the readings. Patient denies any additional symptoms at this time.

## 2020-08-11 NOTE — ED Provider Notes (Signed)
[facility] Emergency Department Provider Note   ____________________________________________    I have reviewed the triage vital signs and the nursing notes.   HISTORY  Chief Complaint Hypertension     HPI Brianna Wagner is a 55 y.o. female with a history of high blood pressure who reports that she checked her blood pressure with a home wrist monitor and it read over 200 systolic.  She spoke to her doctor who recommended she come to the emergency room.  Patient reports he feels well she has no complaints.  No chest pain nausea vomiting or headaches.  No neuro deficits.  Recently started taking hydrochlorothiazide again after a several year pause with good control with lifestyle changes  Past Medical History:  Diagnosis Date  . Frequent headaches   . Hypertension     Patient Active Problem List   Diagnosis Date Noted  . Snoring 05/19/2020  . Overweight 03/07/2020  . Left leg numbness 11/27/2019  . Fatigue 11/27/2019  . Nonspecific chest pain 09/23/2017  . Screening for cervical cancer 09/23/2017  . Menorrhagia with regular cycle 09/23/2017  . Screen for colon cancer 09/23/2017  . Thyroid nodule 06/11/2017  . HTN (hypertension) 06/02/2017  . Acute nonintractable headache 05/12/2017  . Routine general medical examination at a health care facility 11/05/2013  . Encounter to establish care 11/05/2013    Past Surgical History:  Procedure Laterality Date  . TOOTH EXTRACTION    . WISDOM TOOTH EXTRACTION      Prior to Admission medications   Medication Sig Start Date End Date Taking? Authorizing Provider  buPROPion (WELLBUTRIN XL) 150 MG 24 hr tablet Start 150 mg ER PO qam, increase after 3 days to 300 mg qam. 03/07/20   [name], FNP  busPIRone (BUSPAR) 5 MG tablet Take 1 tablet (5 mg total) by mouth 3 (three) times daily as needed. 06/11/20   [name], FNP  Multiple Vitamin (MULTIVITAMIN) tablet Take 1 tablet by mouth daily.     [provider]     Allergies Desogestrel-ethinyl estradiol and Penicillins  Family History  Problem Relation Age of Onset  . Cancer Mother 26       ?unsure if pancreatic cancer.   . Hypertension Mother   . Stroke Mother   . Thyroid disease Mother   . Heart disease Mother   . Cancer Father   . Heart attack Father        4 years of age  . Melanoma Father        89 years  . Stomach cancer Father   . Heart disease Father   . Cancer Sister 27       breast  . Thyroid disease Sister   . Thyroid disease Maternal Aunt   . Breast cancer Sister 43    Social History Social History   Tobacco Use  . Smoking status: Never Smoker  . Smokeless tobacco: Never Used  Vaping Use  . Vaping Use: Never used  Substance Use Topics  . Alcohol use: Yes    Alcohol/week: 8.0 standard drinks    Types: 8 Cans of beer per week    Comment: 2-3 beers per week, approx 2-3 occasions. Social  . Drug use: No    Review of Systems  Constitutional: No fever/chills  ENT: No sore throat.   Gastrointestinal: No abdominal pain.  No nausea, no vomiting.   Genitourinary: Negative for dysuria. Musculoskeletal: Negative for back pain. Skin: Negative for rash. Neurological: Negative  for headaches     ____________________________________________   PHYSICAL EXAM:  VITAL SIGNS: ED Triage Vitals  Enc Vitals Group     BP 08/11/20 1216 (!) 150/91     Pulse Rate 08/11/20 1216 61     Resp 08/11/20 1216 18     Temp 08/11/20 1216 98.2 F (36.8 C)     Temp Source 08/11/20 1216 Oral     SpO2 08/11/20 1216 100 %     Weight 08/11/20 1217 104.3 kg (230 lb)     Height 08/11/20 1217 1.803 m (5\' 11" )     Head Circumference --      Peak Flow --      Pain Score 08/11/20 1228 0     Pain Loc --      Pain Edu? --      Excl. in GC? --      Constitutional: Alert and oriented. No acute distress. Pleasant and interactive Eyes: Conjunctivae are normal.  PERRLA  Nose: No  congestion/rhinnorhea. Mouth/Throat: Mucous membranes are moist.   Cardiovascular: Normal rate, regular rhythm.  Respiratory: Normal respiratory effort.  No retractions.  Musculoskeletal: No lower extremity tenderness nor edema.   Neurologic:  Normal speech and language. No gross focal neurologic deficits are appreciated.   Skin:  Skin is warm, dry and intact. No rash noted.   ____________________________________________   LABS (all labs ordered are listed, but only abnormal results are displayed)  Labs Reviewed - No data to display ____________________________________________  EKG   ____________________________________________  RADIOLOGY   ____________________________________________   PROCEDURES  Procedure(s) performed: No  Procedures   Critical Care performed: No ____________________________________________   INITIAL IMPRESSION / ASSESSMENT AND PLAN / ED COURSE  Pertinent labs & imaging results that were available during my care of the patient were reviewed by me and considered in my medical decision making (see chart for details).  Patient well-appearing and asymptomatic at this time.  Initial triage blood pressure 150s systolic, patient is reassured by this.  Recheck in room, 135/99 suspect BP cuff malfunction at home, no indication for further testing at this time, appropriate for discharge   ____________________________________________   FINAL CLINICAL IMPRESSION(S) / ED DIAGNOSES  Final diagnoses:  Primary hypertension      NEW MEDICATIONS STARTED DURING THIS VISIT:  Discharge Medication List as of 08/11/2020 12:57 PM       Note:  This document was prepared using Dragon voice recognition software and may include unintentional dictation errors.   10/09/2020, MD 08/11/20 1320

## 2020-08-11 NOTE — Telephone Encounter (Signed)
Called patient Brianna Wagner at home 217/134 could not remember pulse advised she needs to be seen immediately at [facility] patient stated she is on her way to [facility] walk in clinic now.

## 2020-08-11 NOTE — Telephone Encounter (Signed)
Patient states her BP was running on 08/08/2020-217/160, that was the highest. Patient started fluid pills, bottom number number is around 120. Yesterday bottom number was 99. Patient states she is not feeling bad today, and has not check her bp. When she gets home she will check BP. Patietn stated she will be home around 10:30am.

## 2020-08-11 NOTE — ED Triage Notes (Signed)
Pt to ER states that she has a new home BP monitor and it was registering very high. She states she called her PCP her could not see her and told her she needed to come to the ER.

## 2020-08-12 NOTE — Telephone Encounter (Signed)
Seen at [facility] ed

## 2020-08-26 ENCOUNTER — Encounter: Payer: Self-pay | Admitting: Family

## 2020-08-26 DIAGNOSIS — I10 Essential (primary) hypertension: Secondary | ICD-10-CM

## 2020-08-27 MED ORDER — HYDROCHLOROTHIAZIDE 25 MG PO TABS: 25.0000 mg | ORAL_TABLET | Freq: Every day | ORAL | 3 refills | Status: DC

## 2020-08-27 MED ORDER — AMLODIPINE BESYLATE 5 MG PO TABS: 5.0000 mg | ORAL_TABLET | Freq: Every day | ORAL | 3 refills | Status: DC

## 2020-08-27 NOTE — Telephone Encounter (Signed)
Called and spoke to [place]. Scheduled BMP labs for 08/28/2020 for 2pm and Pt already has a F/u scheduled for 10/10/2020. Retal verbalized understanding and had no further questions.

## 2020-08-27 NOTE — Telephone Encounter (Signed)
Brianna Wagner,   Call pt sch BMP lab for 08/28/20. She prefers morning sch f/u HTN appt with me in a 4-6 weeks Denies exertional chest pain or pressure, numbness or tingling radiating to left arm or jaw, palpitations, dizziness, frequent headaches, changes in vision, or shortness of breath.    Spoke with pt Had been on hctz 25mg  in the past and restarted. Not sure if expired. Limiting salt BP at home 140/100.  Plan:  start hctz 25mg  that I prescribed; if after 5-6 days, she doesn't have improvement and approach 120/80, she will then start amlodipine 5mg .  She will call if BP not at goal.

## 2020-08-28 ENCOUNTER — Other Ambulatory Visit: Payer: BC Managed Care – PPO

## 2020-08-28 ENCOUNTER — Other Ambulatory Visit: Payer: Self-pay

## 2020-08-28 DIAGNOSIS — I10 Essential (primary) hypertension: Secondary | ICD-10-CM

## 2020-08-29 LAB — BASIC METABOLIC PANEL
BUN: 15 mg/dL (ref 7–25)
CO2: 30 mmol/L (ref 20–32)
Calcium: 9.8 mg/dL (ref 8.6–10.4)
Chloride: 99 mmol/L (ref 98–110)
Creat: 1.01 mg/dL (ref 0.50–1.05)
Glucose, Bld: 106 mg/dL — ABNORMAL HIGH (ref 65–99)
Potassium: 3.8 mmol/L (ref 3.5–5.3)
Sodium: 137 mmol/L (ref 135–146)

## 2020-09-22 ENCOUNTER — Encounter: Payer: Self-pay | Admitting: Family

## 2020-09-22 ENCOUNTER — Other Ambulatory Visit: Payer: Self-pay | Admitting: Obstetrics and Gynecology

## 2020-09-22 DIAGNOSIS — N938 Other specified abnormal uterine and vaginal bleeding: Secondary | ICD-10-CM

## 2020-09-23 ENCOUNTER — Encounter: Payer: Self-pay | Admitting: Family

## 2020-09-30 ENCOUNTER — Ambulatory Visit: Payer: BC Managed Care – PPO | Admitting: Obstetrics and Gynecology

## 2020-09-30 ENCOUNTER — Other Ambulatory Visit: Payer: Self-pay

## 2020-09-30 ENCOUNTER — Encounter: Payer: Self-pay | Admitting: Obstetrics and Gynecology

## 2020-09-30 VITALS — BP 146/91 | HR 68 | Ht 71.0 in | Wt 245.9 lb

## 2020-09-30 DIAGNOSIS — N951 Menopausal and female climacteric states: Secondary | ICD-10-CM

## 2020-09-30 DIAGNOSIS — N938 Other specified abnormal uterine and vaginal bleeding: Secondary | ICD-10-CM

## 2020-09-30 NOTE — Progress Notes (Signed)
HPI:      [name] is a 54 y.o. No obstetric history on file. who LMP was No LMP recorded (lmp unknown). (Menstrual status: Irregular Periods).  Subjective:   She presents today because she has again begun to have irregular bleeding.  Approximately 2 years ago she had irregular bleeding and underwent endometrial biopsy which proved negative.  She was briefly started on Aygestin and HRT.  Patient stopped all medications and has had irregular menses since.  She has had some months where she skips periods and some months where she has a prolonged menses. She does state that she has begun to have some hot flashes.  She is not sure if she is yet in menopause. Her main goal today is to discuss something to stop her bleeding or make it light and regular.    Hx: The following portions of the patient's history were reviewed and updated as appropriate:             She  has a past medical history of Frequent headaches and Hypertension. She does not have any pertinent problems on file. She  has a past surgical history that includes Tooth extraction and Wisdom tooth extraction. Her family history includes Breast cancer (age of onset: 44) in her sister; Cancer in her father; Cancer (age of onset: 33) in her sister; Cancer (age of onset: 42) in her mother; Heart attack in her father; Heart disease in her father and mother; Hypertension in her mother; Melanoma in her father; Stomach cancer in her father; Stroke in her mother; Thyroid disease in her maternal aunt, mother, and sister. She  reports that she has never smoked. She has never used smokeless tobacco. She reports current alcohol use of about 8.0 standard drinks of alcohol per week. She reports that she does not use drugs. She has a current medication list which includes the following prescription(s): hydrochlorothiazide, multivitamin, amlodipine, bupropion, and buspirone. She is allergic to desogestrel-ethinyl estradiol and penicillins.        Review of Systems:  Review of Systems  Constitutional: Denied constitutional symptoms, night sweats, recent illness, fatigue, fever, insomnia and weight loss.  Eyes: Denied eye symptoms, eye pain, photophobia, vision change and visual disturbance.  Ears/Nose/Throat/Neck: Denied ear, nose, throat or neck symptoms, hearing loss, nasal discharge, sinus congestion and sore throat.  Cardiovascular: Denied cardiovascular symptoms, arrhythmia, chest pain/pressure, edema, exercise intolerance, orthopnea and palpitations.  Respiratory: Denied pulmonary symptoms, asthma, pleuritic pain, productive sputum, cough, dyspnea and wheezing.  Gastrointestinal: Denied, gastro-esophageal reflux, melena, nausea and vomiting.  Genitourinary: See HPI for additional information.  Musculoskeletal: Denied musculoskeletal symptoms, stiffness, swelling, muscle weakness and myalgia.  Dermatologic: Denied dermatology symptoms, rash and scar.  Neurologic: Denied neurology symptoms, dizziness, headache, neck pain and syncope.  Psychiatric: Denied psychiatric symptoms, anxiety and depression.  Endocrine: Denied endocrine symptoms including hot flashes and night sweats.   Meds:   Current Outpatient Medications on File Prior to Visit  Medication Sig Dispense Refill  . hydrochlorothiazide (HYDRODIURIL) 25 MG tablet Take 1 tablet (25 mg total) by mouth daily. 90 tablet 3  . Multiple Vitamin (MULTIVITAMIN) tablet Take 1 tablet by mouth daily.    Marland Kitchen amLODipine (NORVASC) 5 MG tablet Take 1 tablet (5 mg total) by mouth daily. (Patient not taking: Reported on 09/30/2020) 90 tablet 3  . buPROPion (WELLBUTRIN XL) 150 MG 24 hr tablet Start 150 mg ER PO qam, increase after 3 days to 300 mg qam. (Patient not taking: Reported on 09/30/2020) 120 tablet  3  . busPIRone (BUSPAR) 5 MG tablet Take 1 tablet (5 mg total) by mouth 3 (three) times daily as needed. (Patient not taking: Reported on 09/30/2020) 60 tablet 1   No current  facility-administered medications on file prior to visit.          Objective:     Vitals:   09/30/20 1501  BP: (!) 146/91  Pulse: 68   Filed Weights   09/30/20 1501  Weight: 245 lb 14.4 oz (111.5 kg)                Assessment:    No obstetric history on file. Patient Active Problem List   Diagnosis Date Noted  . Snoring 05/19/2020  . Overweight 03/07/2020  . Left leg numbness 11/27/2019  . Fatigue 11/27/2019  . Nonspecific chest pain 09/23/2017  . Screening for cervical cancer 09/23/2017  . Menorrhagia with regular cycle 09/23/2017  . Screen for colon cancer 09/23/2017  . Thyroid nodule 06/11/2017  . HTN (hypertension) 06/02/2017  . Acute nonintractable headache 05/12/2017  . Routine general medical examination at a health care facility 11/05/2013  . Encounter to establish care 11/05/2013     1. Symptomatic menopausal or female climacteric states   2. Dysfunctional uterine bleeding     Patient likely oligo ovulatory -perimenopausal.  In the past she has been very reluctant to use anything to control the bleeding or cycles.  Her position on this has softened since then.   Plan:            1.  FSH to rule out menopause if patient is in menopause would consider ultrasound/endometrial biopsy for postmenopausal bleeding.  2.  Multiple hormonal methods of cycle control and prevention of endometrial hyperplasia discussed including, Depo-Provera, cyclic progesterone, Mirena IUD, OCPs. Advantages and risks of each discussed in detail and individually.  3.  Temporary progesterone to stop her bleeding was discussed but patient decided upon other strategy.  Patient leaning toward Mirena IUD.  She would like to research it and see what her [facility] shows.  When she has made decision she was scheduled for IUD or to discuss other management.  Patient overdue for Pap smear will need to schedule annual examination in the near future. Orders Orders Placed This Encounter   Procedures  . Follicle stimulating hormone    No orders of the defined types were placed in this encounter.     F/U  No follow-ups on file. I spent 32 minutes involved in the care of this patient preparing to see the patient by obtaining and reviewing her medical history (including labs, imaging tests and prior procedures), documenting clinical information in the electronic health record (EHR), counseling and coordinating care plans, writing and sending prescriptions, ordering tests or procedures and directly communicating with the patient by discussing pertinent items from her history and physical exam as well as detailing my assessment and plan as noted above so that she has an informed understanding.  All of her questions were answered.  Elonda Husky, M.D. 09/30/2020 3:47 PM

## 2020-10-01 LAB — FOLLICLE STIMULATING HORMONE: FSH: 3.3 m[IU]/mL

## 2020-10-10 ENCOUNTER — Ambulatory Visit: Payer: BC Managed Care – PPO | Admitting: Family

## 2020-10-13 ENCOUNTER — Encounter: Payer: Self-pay | Admitting: Family

## 2020-10-13 ENCOUNTER — Other Ambulatory Visit: Payer: Self-pay

## 2020-10-13 ENCOUNTER — Ambulatory Visit: Payer: BC Managed Care – PPO | Admitting: Family

## 2020-10-13 VITALS — BP 122/88 | HR 76 | Temp 98.1°F | Ht 71.0 in | Wt 248.2 lb

## 2020-10-13 DIAGNOSIS — N920 Excessive and frequent menstruation with regular cycle: Secondary | ICD-10-CM | POA: Diagnosis not present

## 2020-10-13 DIAGNOSIS — I10 Essential (primary) hypertension: Secondary | ICD-10-CM

## 2020-10-13 NOTE — Assessment & Plan Note (Signed)
Uncontrolled. Long discussion in regards to IUD being less invasive and placed in office with Dr [name]. She would like to consider this and also would like a second opinion of all. Referral placed.

## 2020-10-13 NOTE — Progress Notes (Signed)
Subjective:    Patient ID: Brianna Wagner, female    DOB: 11/10/1965, 55 y.o.   MRN: [id number]  CC: Brianna Wagner is a 55 y.o. female who presents today for follow up.   HPI: Continues to have menorrhagia , she has bleed for  3 weeks consecutively. She has felt uncomfortable with IUD and has leaned towards endometrial ablation.  She would like second opinon. Endorses fatigue.  HTN- hasnt needed amlodipine.  Compliant with hctz 25mg  No cp, sob    Consult with Dr 09/30/20 for mennorhagia. She had used progesterone in the past to temporarily stop menorrhea. Discussed IUD, endometrial ablation, progesterone, hysterectomy.  FSH 3.3  HISTORY:  Past Medical History:  Diagnosis Date  . Frequent headaches   . Hypertension    Past Surgical History:  Procedure Laterality Date  . TOOTH EXTRACTION    . WISDOM TOOTH EXTRACTION     Family History  Problem Relation Age of Onset  . Cancer Mother 18       ?unsure if pancreatic cancer.   . Hypertension Mother   . Stroke Mother   . Thyroid disease Mother   . Heart disease Mother   . Cancer Father   . Heart attack Father        48 years of age  . Melanoma Father        89 years  . Stomach cancer Father   . Heart disease Father   . Cancer Sister 16       breast  . Thyroid disease Sister   . Thyroid disease Maternal Aunt   . Breast cancer Sister 60    Allergies: Desogestrel-ethinyl estradiol and Penicillins Current Outpatient Medications on File Prior to Visit  Medication Sig Dispense Refill  . hydrochlorothiazide (HYDRODIURIL) 25 MG tablet Take 1 tablet (25 mg total) by mouth daily. 90 tablet 3  . Multiple Vitamin (MULTIVITAMIN) tablet Take 1 tablet by mouth daily.     No current facility-administered medications on file prior to visit.    Social History   Tobacco Use  . Smoking status: Never Smoker  . Smokeless tobacco: Never Used  Vaping Use  . Vaping Use: Never used  Substance Use Topics  . Alcohol use: Yes     Alcohol/week: 8.0 standard drinks    Types: 8 Cans of beer per week    Comment: 2-3 beers per week, approx 2-3 occasions. Social  . Drug use: No    Review of Systems  Constitutional: Positive for fatigue. Negative for chills and fever.  Respiratory: Negative for cough.   Cardiovascular: Negative for chest pain and palpitations.  Gastrointestinal: Negative for nausea and vomiting.  Genitourinary: Positive for menstrual problem and vaginal bleeding.      Objective:    BP 122/88   Pulse 76   Temp 98.1 F (36.7 C)   Ht 5\' 11"  (1.803 m)   Wt 248 lb 3.2 oz (112.6 kg)   LMP  (LMP Unknown)   SpO2 99%   BMI 34.62 kg/m  BP Readings from Last 3 Encounters:  10/13/20 122/88  09/30/20 (!) 146/91  08/11/20 (!) 135/99   Wt Readings from Last 3 Encounters:  10/13/20 248 lb 3.2 oz (112.6 kg)  09/30/20 245 lb 14.4 oz (111.5 kg)  08/11/20 230 lb (104.3 kg)    Physical Exam Vitals reviewed.  Constitutional:      Appearance: She is well-developed and well-nourished.  Eyes:     Conjunctiva/sclera: Conjunctivae normal.  Cardiovascular:  Rate and Rhythm: Normal rate and regular rhythm.     Pulses: Normal pulses.     Heart sounds: Normal heart sounds.  Pulmonary:     Effort: Pulmonary effort is normal.     Breath sounds: Normal breath sounds. No wheezing, rhonchi or rales.  Skin:    General: Skin is warm and dry.  Neurological:     Mental Status: She is alert.  Psychiatric:        Mood and Affect: Mood and affect normal.        Speech: Speech normal.        Behavior: Behavior normal.        Thought Content: Thought content normal.        Assessment & Plan:   Problem List Items Addressed This Visit      Cardiovascular and Mediastinum   HTN (hypertension) - Primary    Controlled. Continue hctz 25mg       Relevant Orders   Comprehensive metabolic panel   Hemoglobin A1c   Lipid panel   TSH   VITAMIN D 25 Hydroxy (Vit-D Deficiency, Fractures)     Other    Menorrhagia with regular cycle    Uncontrolled. Long discussion in regards to IUD being less invasive and placed in office with Dr . She would like to consider this and also would like a second opinion of all. Referral placed.       Relevant Orders   CBC with Differential/Platelet   Iron, TIBC and Ferritin Panel   Ambulatory referral to Obstetrics / Gynecology       I have discontinued Brianna Wagner's buPROPion, busPIRone, and amLODipine. I am also having her maintain her multivitamin and hydrochlorothiazide.   No orders of the defined types were placed in this encounter.   Return precautions given.   Risks, benefits, and alternatives of the medications and treatment plan prescribed today were discussed, and patient expressed understanding.   Education regarding symptom management and diagnosis given to patient on AVS.  Continue to follow with Brianna Bores, FNP for routine health maintenance.   Brianna Wagner and I agreed with plan.   Brianna Copper, FNP

## 2020-10-13 NOTE — Patient Instructions (Signed)
Referral to GYN Let us know if you dont hear back within a week in regards to an appointment being scheduled.

## 2020-10-13 NOTE — Assessment & Plan Note (Signed)
Controlled. Continue hctz 25mg 

## 2020-10-14 ENCOUNTER — Telehealth: Payer: Self-pay

## 2020-10-14 LAB — CBC WITH DIFFERENTIAL/PLATELET
Absolute Monocytes: 548 cells/uL (ref 200–950)
Basophils Absolute: 82 cells/uL (ref 0–200)
Basophils Relative: 1.3 %
Eosinophils Absolute: 120 cells/uL (ref 15–500)
Eosinophils Relative: 1.9 %
HCT: 29.3 % — ABNORMAL LOW (ref 35.0–45.0)
Hemoglobin: 9.6 g/dL — ABNORMAL LOW (ref 11.7–15.5)
Lymphs Abs: 1197 cells/uL (ref 850–3900)
MCH: 30 pg (ref 27.0–33.0)
MCHC: 32.8 g/dL (ref 32.0–36.0)
MCV: 91.6 fL (ref 80.0–100.0)
MPV: 11.2 fL (ref 7.5–12.5)
Monocytes Relative: 8.7 %
Neutro Abs: 4353 cells/uL (ref 1500–7800)
Neutrophils Relative %: 69.1 %
Platelets: 352 10*3/uL (ref 140–400)
RBC: 3.2 10*6/uL — ABNORMAL LOW (ref 3.80–5.10)
RDW: 13 % (ref 11.0–15.0)
Total Lymphocyte: 19 %
WBC: 6.3 10*3/uL (ref 3.8–10.8)

## 2020-10-14 LAB — IRON,TIBC AND FERRITIN PANEL
%SAT: 10 % (calc) — ABNORMAL LOW (ref 16–45)
Ferritin: 7 ng/mL — ABNORMAL LOW (ref 16–232)
Iron: 43 ug/dL — ABNORMAL LOW (ref 45–160)
TIBC: 415 mcg/dL (calc) (ref 250–450)

## 2020-10-14 LAB — COMPREHENSIVE METABOLIC PANEL
AG Ratio: 1.5 (calc) (ref 1.0–2.5)
ALT: 14 U/L (ref 6–29)
AST: 18 U/L (ref 10–35)
Albumin: 3.7 g/dL (ref 3.6–5.1)
Alkaline phosphatase (APISO): 61 U/L (ref 37–153)
BUN: 13 mg/dL (ref 7–25)
CO2: 28 mmol/L (ref 20–32)
Calcium: 9.3 mg/dL (ref 8.6–10.4)
Chloride: 102 mmol/L (ref 98–110)
Creat: 0.94 mg/dL (ref 0.50–1.05)
Globulin: 2.5 g/dL (calc) (ref 1.9–3.7)
Glucose, Bld: 82 mg/dL (ref 65–99)
Potassium: 4.2 mmol/L (ref 3.5–5.3)
Sodium: 137 mmol/L (ref 135–146)
Total Bilirubin: 0.3 mg/dL (ref 0.2–1.2)
Total Protein: 6.2 g/dL (ref 6.1–8.1)

## 2020-10-14 LAB — LIPID PANEL
Cholesterol: 131 mg/dL (ref <200)
HDL: 52 mg/dL (ref >=50)
LDL Cholesterol (Calc): 55 mg/dL (calc)
Non-HDL Cholesterol (Calc): 79 mg/dL (calc) (ref <130)
Total CHOL/HDL Ratio: 2.5 (calc) (ref <5.0)
Triglycerides: 163 mg/dL — ABNORMAL HIGH (ref <150)

## 2020-10-14 LAB — HEMOGLOBIN A1C
Hgb A1c MFr Bld: 4.9 % of total Hgb (ref <5.7)
Mean Plasma Glucose: 94 mg/dL
eAG (mmol/L): 5.2 mmol/L

## 2020-10-14 LAB — TSH: TSH: 1.52 mIU/L

## 2020-10-14 LAB — VITAMIN D 25 HYDROXY (VIT D DEFICIENCY, FRACTURES): Vit D, 25-Hydroxy: 53 ng/mL (ref 30–100)

## 2020-10-14 NOTE — Telephone Encounter (Signed)
[facility] referring for menorrhagia, second opinion.. Called and left voicemail for patient to call back to be scheduled.

## 2020-10-15 ENCOUNTER — Other Ambulatory Visit: Payer: Self-pay | Admitting: Obstetrics and Gynecology

## 2020-10-15 ENCOUNTER — Other Ambulatory Visit: Payer: Self-pay | Admitting: Surgical

## 2020-10-15 ENCOUNTER — Telehealth: Payer: Self-pay | Admitting: Obstetrics and Gynecology

## 2020-10-15 DIAGNOSIS — N938 Other specified abnormal uterine and vaginal bleeding: Secondary | ICD-10-CM

## 2020-10-15 MED ORDER — NORETHINDRONE 0.35 MG PO TABS
1.0000 | ORAL_TABLET | Freq: Every day | ORAL | 11 refills | Status: DC
Start: 2020-10-15 — End: 2020-11-26

## 2020-10-15 NOTE — Telephone Encounter (Signed)
New message   Patient is asking for previous medication was prescribe by Dr. [name] years ago does not know the name  Patient sent a my chart messages on 3.7.22   [facility]

## 2020-10-15 NOTE — Telephone Encounter (Signed)
Lm that Dr. [name] has sent Micronor to the pharmacy. He had just sent it today.

## 2020-11-03 ENCOUNTER — Other Ambulatory Visit: Payer: Self-pay | Admitting: Obstetrics and Gynecology

## 2020-11-03 ENCOUNTER — Other Ambulatory Visit: Payer: Self-pay

## 2020-11-03 ENCOUNTER — Encounter: Payer: Self-pay | Admitting: Obstetrics and Gynecology

## 2020-11-03 ENCOUNTER — Ambulatory Visit (INDEPENDENT_AMBULATORY_CARE_PROVIDER_SITE_OTHER): Payer: BC Managed Care – PPO | Admitting: Obstetrics and Gynecology

## 2020-11-03 ENCOUNTER — Ambulatory Visit
Admission: RE | Admit: 2020-11-03 | Discharge: 2020-11-03 | Disposition: A | Discharge status: Home / Self Care | Payer: BC Managed Care – PPO | Source: Ambulatory Visit | Attending: Obstetrics and Gynecology | Admitting: Obstetrics and Gynecology

## 2020-11-03 VITALS — BP 130/74 | Ht 71.0 in | Wt 247.8 lb

## 2020-11-03 DIAGNOSIS — N939 Abnormal uterine and vaginal bleeding, unspecified: Secondary | ICD-10-CM | POA: Diagnosis not present

## 2020-11-03 DIAGNOSIS — N852 Hypertrophy of uterus: Secondary | ICD-10-CM

## 2020-11-03 DIAGNOSIS — N949 Unspecified condition associated with female genital organs and menstrual cycle: Secondary | ICD-10-CM

## 2020-11-03 DIAGNOSIS — N920 Excessive and frequent menstruation with regular cycle: Secondary | ICD-10-CM | POA: Insufficient documentation

## 2020-11-03 DIAGNOSIS — N938 Other specified abnormal uterine and vaginal bleeding: Secondary | ICD-10-CM | POA: Diagnosis present

## 2020-11-03 NOTE — Patient Instructions (Addendum)
Abnormal Uterine Bleeding  Abnormal uterine bleeding is unusual bleeding from the uterus. It includes bleeding after sex, or bleeding or spotting between menstrual periods. It may also include bleeding that is heavier than normal, menstrual periods that last longer than usual, or bleeding that occurs after menopause. Abnormal uterine bleeding can affect teenagers, women in their reproductive years, pregnant women, and women who have reached menopause. Common causes of abnormal uterine bleeding include:  Pregnancy.  Growths of tissue (polyps).  Benign tumors or growths in the uterus (fibroids). These are not cancer.  Infection.  Cancer.  Too much or too little of some hormones in the body (hormonal imbalances). Any type of abnormal bleeding should be checked by a health care provider. Many cases are minor and simple to treat, but others may be more serious. Treatment will depend on the cause and severity of the bleeding. Follow these instructions at home: Medicines  Take over-the-counter and prescription medicines only as told by your health care provider.  Tell your health care provider about other medicines that you take. You may be asked to stop taking aspirin or medicines that contain aspirin. These medicines can make bleeding worse.  If you were prescribed iron pills, take them as told by your health care provider. Iron pills help to replace iron that your body loses because of this condition. Managing constipation In cases of severe bleeding, you may be asked to increase your iron intake to treat anemia. This may cause constipation. To prevent or treat constipation, you may need to:  Drink enough fluid to keep your urine pale yellow.  Take over-the-counter or prescription medicines.  Eat foods that are high in fiber, such as beans, whole grains, and fresh fruits and vegetables.  Limit foods that are high in fat and processed sugars, such as fried or sweet foods. General  instructions  Monitor your condition for any changes.  Do not use tampons, douche, or have sex until your health care provider says these things are okay.  Change your pads often.  Get regular exams. This includes pelvic exams and cervical cancer screenings. ? It is up to you to get the results of any tests that are done. Ask your health care provider, or the department that is doing the tests, when your results will be ready.  Keep all follow-up visits as told by your health care provider. This is important. Contact a health care provider if you:  Have bleeding that lasts for more than 1 week.  Feel dizzy at times.  Feel nauseous or you vomit.  Feel light-headed or weak.  Notice any other changes that show that your condition is getting worse. Get help right away if you:  Pass out.  Have bleeding that soaks through a pad every hour.  Have pain in the abdomen.  Have a fever or chills.  Become sweaty or weak.  Pass large blood clots from your vagina. Summary  Abnormal uterine bleeding is unusual bleeding from the uterus.  Any type of abnormal bleeding should be evaluated by a health care provider. Many cases are minor and simple to treat, but others may be more serious.  Treatment will depend on the cause of the bleeding.  Get help right away if you pass out, you have bleeding that soaks through a pad every hour, or you pass large blood clots from your vagina. This information is not intended to replace advice given to you by your health care provider. Make sure you discuss any questions you   have with your health care provider. Document Revised: 04/02/2020 Document Reviewed: 05/29/2019 Elsevier Patient Education  2021 [employer] Inc[employer]  Iron-Rich Diet  Iron is a mineral that helps your body to produce hemoglobin. Hemoglobin is a protein in red blood cells that carries oxygen to your body's tissues. Eating too little iron may cause you to feel weak and tired, and it can  increase your risk of infection. Iron is naturally found in many foods, and many foods have iron added to them (iron-fortified foods). You may need to follow an iron-rich diet if you do not have enough iron in your body due to certain medical conditions. The amount of iron that you need each day depends on your age, your sex, and any medical conditions you have. Follow instructions from your health care provider or a diet and nutrition specialist (dietitian) about how much iron you should eat each day. What are tips for following this plan? Reading food labels  Check food labels to see how many milligrams (mg) of iron are in each serving. Cooking  Cook foods in pots and pans that are made from iron.  Take these steps to make it easier for your body to absorb iron from certain foods: ? Soak beans overnight before cooking. ? Soak whole grains overnight and drain them before using. ? Ferment flours before baking, such as by using yeast in bread dough. Meal planning  When you eat foods that contain iron, you should eat them with foods that are high in vitamin C. These include oranges, peppers, tomatoes, potatoes, and mango. Vitamin C helps your body to absorb iron. General information  Take iron supplements only as told by your health care provider. An overdose of iron can be life-threatening. If you were prescribed iron supplements, take them with orange juice or a vitamin C supplement.  When you eat iron-fortified foods or take an iron supplement, you should also eat foods that naturally contain iron, such as meat, poultry, and fish. Eating naturally iron-rich foods helps your body to absorb the iron that is added to other foods or contained in a supplement.  Certain foods and drinks prevent your body from absorbing iron properly. Avoid eating these foods in the same meal as iron-rich foods or with iron supplements. These foods include: ? Coffee, black tea, and red wine. ? Milk, dairy  products, and foods that are high in calcium. ? Beans and soybeans. ? Whole grains. What foods should I eat? Fruits Prunes. Raisins. Eat fruits high in vitamin C, such as oranges, grapefruits, and strawberries, alongside iron-rich foods. Vegetables Spinach (cooked). Green peas. Broccoli. Fermented vegetables. Eat vegetables high in vitamin C, such as leafy greens, potatoes, bell peppers, and tomatoes, alongside iron-rich foods. Grains Iron-fortified breakfast cereal. Iron-fortified whole-wheat bread. Enriched rice. Sprouted grains. Meats and other proteins Beef liver. Oysters. Beef. Shrimp. [place]. Chicken. Tuna. Sardines. Chickpeas. Nuts. Tofu. Pumpkin seeds. Beverages Tomato juice. Fresh orange juice. Prune juice. Hibiscus tea. Fortified instant breakfast shakes. Sweets and desserts Blackstrap molasses. Seasonings and condiments Tahini. Fermented soy sauce. Other foods Wheat germ. The items listed above may not be a complete list of recommended foods and beverages. Contact a dietitian for more information. What foods should I avoid? Grains Whole grains. Bran cereal. Bran flour. Oats. Meats and other proteins Soybeans. Products made from soy protein. Black beans. Lentils. Mung beans. Split peas. Dairy Milk. Cream. Cheese. Yogurt. Cottage cheese. Beverages Coffee. Black tea. Red wine. Sweets and desserts Cocoa. Chocolate. Ice cream. Other foods Basil.  Oregano. Large amounts of parsley. The items listed above may not be a complete list of foods and beverages to avoid. Contact a dietitian for more information. Summary  Iron is a mineral that helps your body to produce hemoglobin. Hemoglobin is a protein in red blood cells that carries oxygen to your body's tissues.  Iron is naturally found in many foods, and many foods have iron added to them (iron-fortified foods).  When you eat foods that contain iron, you should eat them with foods that are high in vitamin C. Vitamin C  helps your body to absorb iron.  Certain foods and drinks prevent your body from absorbing iron properly, such as whole grains and dairy products. You should avoid eating these foods in the same meal as iron-rich foods or with iron supplements. This information is not intended to replace advice given to you by your health care provider. Make sure you discuss any questions you have with your health care provider. Document Revised: 07/08/2017 Document Reviewed: 06/21/2017 Elsevier Patient Education  2021 [employer].

## 2020-11-03 NOTE — Progress Notes (Signed)
Patient ID: Brianna Wagner, female   DOB: September 02, 1965, 55 y.o.   MRN: [id number]  Reason for Consult: Gynecologic Exam   Referred by [name], FNP  Subjective:     HPI:  Becky Colan is a 55 y.o. female she presents today with concerns regarding abnormal uterine bleeding.  She reports that she started having abnormal uterine bleeding in 2020.  She reports that she start started seeing Dr. [name] at that time and she was placed on hormonal medication her birth control.  This helped stop her bleeding after several weeks of treatment and she eventually discontinued the medication.  Her bleeding then occurred more irregularly sometimes every 2 months sometimes every 2 days.  She went a several month time period of no bleeding and then began having heavy bleeding again in January 2022.  She reports on February 21 she again saw Dr. [name] who reviewed options and recommended an IUD for her.  She declined the IUD.  She is strongly opposed to having IUD placed.  She was instead started on Micronor 0.35 mg a day.  She has been taking this pill since she saw her primary care provider, Rennie Plowman NP, on March 7 who did recommend starting medication.  She reports that she took the pill daily.  She doubled up on the pills for a little bit to help control the bleeding.  She reports that finally on Friday March 22 her bleeding stopped.  She reports that she has been having very heavy bleeding for several months ago.  She reports that she does past palm-sized clots.  She reports that she has sensations of gushing and flooding.  She has saturated a pad and tampon more frequently than every hour.  She has accidents where she bleeds through her clothes.  She also has severe pain during her periods which prevents her from attending work.  She had a recent CBC which showed anemia as well as low ferritin levels.  She had recent [facility] testing which was not suggestive of menopause. She has been thought followed  in the past for thyroid nodule and her recent TSH labs were normal.   Few days ago she started a multivitamin called Geritol.  It contains 60 mg of elemental iron.  Endometrial biopsy 06/05/2019:  A. ENDOMETRIUM, BIOPSY:  - Inactive endometrium with progestin effect  - No hyperplasia or malignancy identified   Gynecological History  Patient's last menstrual period was 10/15/2020. Menarche: 15 Menopause: no  History of fibroids, polyps, or ovarian cysts? : no  History of PCOS? no Hstory of Endometriosis? no History of abnormal pap smears? no Have you had any sexually transmitted infections in the past? no  Last Pap: Results were: 09/23/2017 NIL HPV negative   She identifies as a female. She is not sexually active.  She currently uses oral progesterone-only contraceptive for contraception.   Obstetrical History OB History  Gravida Para Term Preterm AB Living  1 1 1     1   SAB IAB Ectopic Multiple Live Births          1    # Outcome Date GA Lbr Len/2nd Weight Sex Delivery Anes PTL Lv  1 Term 12/31/91    F Vag-Spont   LIV     Past Medical History:  Diagnosis Date  . Frequent headaches   . Hypertension    Family History  Problem Relation Age of Onset  . Cancer Mother 40       ?unsure if pancreatic  cancer.   . Hypertension Mother   . Stroke Mother   . Thyroid disease Mother   . Heart disease Mother   . Cancer Father   . Heart attack Father        58 years of age  . Melanoma Father        89 years  . Stomach cancer Father   . Heart disease Father   . Cancer Sister 11       breast  . Thyroid disease Sister   . Thyroid disease Maternal Aunt   . Breast cancer Sister 41   Past Surgical History:  Procedure Laterality Date  . TOOTH EXTRACTION    . WISDOM TOOTH EXTRACTION      Short Social History:  Social History   Tobacco Use  . Smoking status: Never Smoker  . Smokeless tobacco: Never Used  Substance Use Topics  . Alcohol use: Yes    Alcohol/week:  8.0 standard drinks    Types: 8 Cans of beer per week    Comment: 2-3 beers per week, approx 2-3 occasions. Social    Allergies  Allergen Reactions  . Desogestrel-Ethinyl Estradiol Other (See Comments)    fatigue  . Penicillins     Current Outpatient Medications  Medication Sig Dispense Refill  . hydrochlorothiazide (HYDRODIURIL) 25 MG tablet Take 1 tablet (25 mg total) by mouth daily. 90 tablet 3  . Multiple Vitamin (MULTIVITAMIN) tablet Take 1 tablet by mouth daily.    . norethindrone (MICRONOR) 0.35 MG tablet Take 1 tablet (0.35 mg total) by mouth daily. 28 tablet 11   No current facility-administered medications for this visit.    Review of Systems  Constitutional: Positive for fatigue. Negative for chills, fever and unexpected weight change.  HENT: Negative for trouble swallowing.  Eyes: Negative for loss of vision.  Respiratory: Negative for cough, shortness of breath and wheezing.  Cardiovascular: Negative for chest pain, leg swelling, palpitations and syncope.  GI: Negative for abdominal pain, blood in stool, diarrhea, nausea and vomiting.  GU: Negative for difficulty urinating, dysuria, frequency and hematuria.  Musculoskeletal: Negative for back pain, leg pain and joint pain.  Skin: Negative for rash.  Neurological: Negative for dizziness, headaches, light-headedness, numbness and seizures.  Psychiatric: Negative for behavioral problem, confusion, depressed mood and sleep disturbance.        Objective:  Objective   Vitals:   11/03/20 1508  BP: 130/74  Weight: 247 lb 12.8 oz (112.4 kg)  Height: 5\' 11"  (1.803 m)   Body mass index is 34.56 kg/m.  Physical Exam Vitals and nursing note reviewed. Exam conducted with a chaperone present.  Constitutional:      Appearance: Normal appearance. She is well-developed.  HENT:     Head: Normocephalic and atraumatic.  Eyes:     Extraocular Movements: Extraocular movements intact.     Pupils: Pupils are equal, round,  and reactive to light.  Cardiovascular:     Rate and Rhythm: Normal rate and regular rhythm.  Pulmonary:     Effort: Pulmonary effort is normal. No respiratory distress.     Breath sounds: Normal breath sounds.  Abdominal:     General: Abdomen is flat.     Palpations: Abdomen is soft.  Genitourinary:    Comments: External: Normal appearing vulva. No lesions noted.  Speculum examination: Cervix is very far posterior and tilted to the patient's left. Normal appearing cervix. No blood in the vaginal vault. White discharge.Bimanual examination: Uterus tilted. Right adnexal mass palpated, uterine  or adnexal in origin difficult to distinguish. Exam somewhat limited by habitus. No CMT.  No adnexal tenderness. Pelvis not fixed.  Breast exam: exam not performed Musculoskeletal:        General: No signs of injury.  Skin:    General: Skin is warm and dry.  Neurological:     Mental Status: She is alert and oriented to person, place, and time.  Psychiatric:        Behavior: Behavior normal.        Thought Content: Thought content normal.        Judgment: Judgment normal.     Assessment/Plan:     55 year old with abnormal uterine bleeding and menorrhagia 1.  Will obtain an urgent pelvic ultrasound today given patient's findings of enlarged uterus vs. pelvic mass.  Ultrasound availability has been very limited at the hospital because of lack of ultrasound technicians.  For this reason this image was ordered as STAT.Currently patient's bleeding is controlled with daily Micronor therapy.  Discussed that she can continue thi regimen or transition therapies.  Patient ultimately is not very interested in IUD. She can consider continued management with daily oral progestins,  cyclical progesterone therapy, or surgical options such as a hysterectomy. 2. Recommended repeat endometrial sampling. This could be done by EMB or hysteroscopy D&C. [place] will aide in planning of this biopsy.  3. Discussed anemia,  recently started oral iron, consider repeating in 4-8 weeks. Can also consider hematology referral for IV iron if oral therapy is not tolerated. Reviewed iron rich foods and provided patient with a list.   Patient will follow up in office next week  More than 45 minutes were spent face to face with the patient in the room, reviewing the medical record, labs and images, and coordinating care for the patient. The plan of management was discussed in detail and counseling was provided.     Adelene Idler MD [facility], [facility] 11/03/2020 3:45 PM

## 2020-11-11 ENCOUNTER — Ambulatory Visit: Payer: BC Managed Care – PPO | Admitting: Obstetrics and Gynecology

## 2020-11-12 ENCOUNTER — Encounter: Payer: Self-pay | Admitting: Family

## 2020-11-13 ENCOUNTER — Telehealth: Payer: Self-pay | Admitting: Obstetrics and Gynecology

## 2020-11-13 NOTE — Telephone Encounter (Signed)
Specifically discussed ultrasound results. Small posterior fibroids, does not appear submucosal at a quick glance, the location is not reported. Right ovarian follicle.  I glanced at this image.  ? Blood flow within follicle versus ovarian stroma that I'm seeing. There may be some acoustical limitations that limit interpretation.  Overall, nothing immediately concerning.  Will defer to Dr. Jerene Pitch for context-specific interpretation and recommendations for next steps.  Patient voiced appreciation for initial notification of results.

## 2020-11-26 ENCOUNTER — Encounter: Payer: Self-pay | Admitting: Obstetrics and Gynecology

## 2020-11-26 ENCOUNTER — Other Ambulatory Visit: Payer: Self-pay

## 2020-11-26 ENCOUNTER — Ambulatory Visit: Payer: BC Managed Care – PPO | Admitting: Obstetrics and Gynecology

## 2020-11-26 VITALS — BP 118/72 | HR 78 | Resp 18 | Ht 71.0 in | Wt 245.0 lb

## 2020-11-26 DIAGNOSIS — D500 Iron deficiency anemia secondary to blood loss (chronic): Secondary | ICD-10-CM | POA: Diagnosis not present

## 2020-11-26 DIAGNOSIS — N939 Abnormal uterine and vaginal bleeding, unspecified: Secondary | ICD-10-CM

## 2020-11-26 MED ORDER — NORETHINDRONE 0.35 MG PO TABS: 1.0000 | ORAL_TABLET | Freq: Every day | ORAL | 3 refills | Status: DC

## 2020-11-26 NOTE — Progress Notes (Signed)
Patient ID: Brianna Wagner, female   DOB: November 18, 1965, 55 y.o.   MRN: [id number]  Reason for Consult: No chief complaint on file.   Referred by [name], FNP  Subjective:     HPI:  Brianna Wagner is a 55 y.o. female.  She is following up today regarding irregular uterine bleeding. She underwent a pelvic US recently. Small intramural fibroid seen, endometrial thickness was 4 mm. She reports that since taking Micronor she has not had additional bleeding.    Past Medical History:  Diagnosis Date  . Frequent headaches   . Hypertension    Family History  Problem Relation Age of Onset  . Cancer Mother 41       ?unsure if pancreatic cancer.   . Hypertension Mother   . Stroke Mother   . Thyroid disease Mother   . Heart disease Mother   . Cancer Father   . Heart attack Father        28 years of age  . Melanoma Father        89 years  . Stomach cancer Father   . Heart disease Father   . Cancer Sister 24       breast  . Thyroid disease Sister   . Thyroid disease Maternal Aunt   . Breast cancer Sister 21   Past Surgical History:  Procedure Laterality Date  . TOOTH EXTRACTION    . WISDOM TOOTH EXTRACTION      Short Social History:  Social History   Tobacco Use  . Smoking status: Never Smoker  . Smokeless tobacco: Never Used  Substance Use Topics  . Alcohol use: Yes    Alcohol/week: 8.0 standard drinks    Types: 8 Cans of beer per week    Comment: 2-3 beers per week, approx 2-3 occasions. Social    Allergies  Allergen Reactions  . Desogestrel-Ethinyl Estradiol Other (See Comments)    fatigue  . Penicillins     Current Outpatient Medications  Medication Sig Dispense Refill  . hydrochlorothiazide (HYDRODIURIL) 25 MG tablet Take 1 tablet (25 mg total) by mouth daily. 90 tablet 3  . Multiple Vitamin (MULTIVITAMIN) tablet Take 1 tablet by mouth daily.    . norethindrone (MICRONOR) 0.35 MG tablet Take 1 tablet (0.35 mg total) by mouth daily. 90 tablet 3    No current facility-administered medications for this visit.    Review of Systems  Constitutional: Negative for chills, fatigue, fever and unexpected weight change.  HENT: Negative for trouble swallowing.  Eyes: Negative for loss of vision.  Respiratory: Negative for cough, shortness of breath and wheezing.  Cardiovascular: Negative for chest pain, leg swelling, palpitations and syncope.  GI: Negative for abdominal pain, blood in stool, diarrhea, nausea and vomiting.  GU: Negative for difficulty urinating, dysuria, frequency and hematuria.  Musculoskeletal: Negative for back pain, leg pain and joint pain.  Skin: Negative for rash.  Neurological: Negative for dizziness, headaches, light-headedness, numbness and seizures.  Psychiatric: Negative for behavioral problem, confusion, depressed mood and sleep disturbance.        Objective:  Objective   Vitals:   11/26/20 1550  BP: 118/72  Pulse: 78  Resp: 18  SpO2: 99%  Weight: 245 lb (111.1 kg)  Height: 5\' 11"  (1.803 m)   Body mass index is 34.17 kg/m.  Physical Exam Vitals and nursing note reviewed. Exam conducted with a chaperone present.  Constitutional:      Appearance: Normal appearance.  HENT:     Head:  Normocephalic and atraumatic.  Eyes:     Extraocular Movements: Extraocular movements intact.     Pupils: Pupils are equal, round, and reactive to light.  Cardiovascular:     Rate and Rhythm: Normal rate and regular rhythm.  Pulmonary:     Effort: Pulmonary effort is normal.     Breath sounds: Normal breath sounds.  Abdominal:     General: Abdomen is flat.     Palpations: Abdomen is soft.  Musculoskeletal:     Cervical back: Normal range of motion.  Skin:    General: Skin is warm and dry.  Neurological:     General: No focal deficit present.     Mental Status: She is alert and oriented to person, place, and time.  Psychiatric:        Behavior: Behavior normal.        Thought Content: Thought content  normal.        Judgment: Judgment normal.     Assessment/Plan:     54 yo with abnormal uterine bleeding 1. Discussed that an endometrium thickness of 4 mm has a negative predictive value of 99% for dysplasia and endometrial cancer. Given that her bleeding has improved with Micronor recommended continued therapy with daily dosing. If persistent bleeding reconsider endometrial sampling.  2.Anemia and fatigue -  Labs ordered.   More than 20 minutes were spent face to face with the patient in the room, reviewing the medical record, labs and images, and coordinating care for the patient. The plan of management was discussed in detail and counseling was provided.      Adelene Idler MD [facility], [facility] 11/26/2020 9:59 PM

## 2020-11-27 LAB — VITAMIN B12: Vitamin B-12: 524 pg/mL (ref 232–1245)

## 2020-11-27 LAB — CBC WITH DIFFERENTIAL/PLATELET
Basophils Absolute: 0.1 10*3/uL (ref 0.0–0.2)
Basos: 1 %
EOS (ABSOLUTE): 0.2 10*3/uL (ref 0.0–0.4)
Eos: 3 %
Hematocrit: 28.8 % — ABNORMAL LOW (ref 34.0–46.6)
Hemoglobin: 8.9 g/dL — ABNORMAL LOW (ref 11.1–15.9)
Immature Grans (Abs): 0 10*3/uL (ref 0.0–0.1)
Immature Granulocytes: 0 %
Lymphocytes Absolute: 1.6 10*3/uL (ref 0.7–3.1)
Lymphs: 29 %
MCH: 25.5 pg — ABNORMAL LOW (ref 26.6–33.0)
MCHC: 30.9 g/dL — ABNORMAL LOW (ref 31.5–35.7)
MCV: 83 fL (ref 79–97)
Monocytes Absolute: 0.6 10*3/uL (ref 0.1–0.9)
Monocytes: 10 %
Neutrophils Absolute: 3.2 10*3/uL (ref 1.4–7.0)
Neutrophils: 57 %
Platelets: 293 10*3/uL (ref 150–450)
RBC: 3.49 x10E6/uL — ABNORMAL LOW (ref 3.77–5.28)
RDW: 14.4 % (ref 11.7–15.4)
WBC: 5.7 10*3/uL (ref 3.4–10.8)

## 2020-11-27 LAB — FERRITIN: Ferritin: 13 ng/mL — ABNORMAL LOW (ref 15–150)

## 2020-11-27 LAB — IRON AND TIBC
Iron Saturation: 7 % — CL (ref 15–55)
Iron: 29 ug/dL (ref 27–159)
Total Iron Binding Capacity: 411 ug/dL (ref 250–450)
UIBC: 382 ug/dL (ref 131–425)

## 2020-11-28 ENCOUNTER — Encounter: Payer: Self-pay | Admitting: Family

## 2021-02-13 ENCOUNTER — Ambulatory Visit: Payer: BC Managed Care – PPO | Admitting: Family

## 2021-02-13 ENCOUNTER — Ambulatory Visit (INDEPENDENT_AMBULATORY_CARE_PROVIDER_SITE_OTHER): Payer: Self-pay | Admitting: Family

## 2021-02-13 ENCOUNTER — Other Ambulatory Visit: Payer: Self-pay

## 2021-02-13 ENCOUNTER — Encounter: Payer: Self-pay | Admitting: Family

## 2021-02-13 DIAGNOSIS — I10 Essential (primary) hypertension: Secondary | ICD-10-CM

## 2021-02-13 DIAGNOSIS — N920 Excessive and frequent menstruation with regular cycle: Secondary | ICD-10-CM

## 2021-02-13 DIAGNOSIS — G4733 Obstructive sleep apnea (adult) (pediatric): Secondary | ICD-10-CM

## 2021-02-13 LAB — CBC WITH DIFFERENTIAL/PLATELET
Basophils Absolute: 0 10*3/uL (ref 0.0–0.1)
Basophils Relative: 0.8 % (ref 0.0–3.0)
Eosinophils Absolute: 0.1 10*3/uL (ref 0.0–0.7)
Eosinophils Relative: 3.2 % (ref 0.0–5.0)
HCT: 32.6 % — ABNORMAL LOW (ref 36.0–46.0)
Hemoglobin: 10.3 g/dL — ABNORMAL LOW (ref 12.0–15.0)
Lymphocytes Relative: 31.1 % (ref 12.0–46.0)
Lymphs Abs: 1.3 10*3/uL (ref 0.7–4.0)
MCHC: 31.7 g/dL (ref 30.0–36.0)
MCV: 73.2 fl — ABNORMAL LOW (ref 78.0–100.0)
Monocytes Absolute: 0.4 10*3/uL (ref 0.1–1.0)
Monocytes Relative: 9.9 % (ref 3.0–12.0)
Neutro Abs: 2.3 10*3/uL (ref 1.4–7.7)
Neutrophils Relative %: 55 % (ref 43.0–77.0)
Platelets: 304 10*3/uL (ref 150.0–400.0)
RBC: 4.45 Mil/uL (ref 3.87–5.11)
RDW: 18.8 % — ABNORMAL HIGH (ref 11.5–15.5)
WBC: 4.1 10*3/uL (ref 4.0–10.5)

## 2021-02-13 LAB — IBC + FERRITIN
Ferritin: 6.6 ng/mL — ABNORMAL LOW (ref 10.0–291.0)
Iron: 31 ug/dL — ABNORMAL LOW (ref 42–145)
Saturation Ratios: 6.2 % — ABNORMAL LOW (ref 20.0–50.0)
Transferrin: 357 mg/dL (ref 212.0–360.0)

## 2021-02-13 MED ORDER — HYDROCHLOROTHIAZIDE 12.5 MG PO TABS: 12.5000 mg | ORAL_TABLET | Freq: Every day | ORAL | 1 refills | Status: DC

## 2021-02-13 NOTE — Assessment & Plan Note (Addendum)
Excellent control. Advised to may use hctz 12.5mg  prn for leg swelling while monitoring BP at home.

## 2021-02-13 NOTE — Patient Instructions (Addendum)
Please call [place] to discuss options for payment with self pay.    [facility]  [address]  [place], [place]  [phone]  It is imperative that you are seen AT least twice per year for labs and monitoring. Goal is less than 120/80, based on newest guidelines, however we certainly want to be less than 130/80;  if persistently higher, please make sooner follow up appointment so we can recheck you blood pressure and manage/ adjust medications.  Nice to see you!

## 2021-02-13 NOTE — Assessment & Plan Note (Signed)
Diagnosed with moderate OSA 05/2020. Advised that fatigue likely multifactorial including untreated OSA.  she declines pursuing getting Cipap. She would like to focus on weight loss. Will continue to discuss.

## 2021-02-13 NOTE — Assessment & Plan Note (Addendum)
Menorrhagia resolved. Colonscopy UTD, Advised stool cards in setting of anemia, she declines. Suspect fatigue related to anemia.  Pending iron stores today to see if hematology referral warranted.

## 2021-02-13 NOTE — Progress Notes (Signed)
Subjective:    Patient ID: Brianna Wagner, female    DOB: Nov 29, 1965, 55 y.o.   MRN: [id number]  CC: Mikenzie Mccannon is a 55 y.o. female who presents today for follow up.   HPI: Complains of fatigue for several months, unchanged  She doesn't feel depressed  She cannot seem to get to the gym on a regular basis due to fatigue, She takes walks infrequently however when she does , it notes without improvement of fatigue.  No cp, sob.   She is compliant with iron once per day. No constipation.   Sleeping well. Approx 7 hours per night.  Sleep is not restorative. She believes she snores.   She is taking HCTZ prn for leg swelling. No leg swelling today.   She follows with GYN, Dr Jerene Pitch as due for pap smear.  She is worried about weight gain and has stopped drinking alcohol and lost weight.   Last seen Dr Jerene Pitch 11/26/20. Bleeding improved on micronor and recommended continued daily dosing.  2  months ago B12 524 Ferritin 13 Hemoglobin 8.9  Diagnosed with moderate OSA 05/2020  Mammogram due. She declines mammogram.  Colonoscopy two years ago.    HISTORY:  Past Medical History:  Diagnosis Date   Frequent headaches    Hypertension    Past Surgical History:  Procedure Laterality Date   TOOTH EXTRACTION     WISDOM TOOTH EXTRACTION     Family History  Problem Relation Age of Onset   Cancer Mother 84       ?unsure if pancreatic cancer.    Hypertension Mother    Stroke Mother    Thyroid disease Mother    Heart disease Mother    Cancer Father    Heart attack Father        31 years of age   Melanoma Father        32 years   Stomach cancer Father    Heart disease Father    Cancer Sister 61       breast   Thyroid disease Sister    Thyroid disease Maternal Aunt    Breast cancer Sister 64    Allergies: Desogestrel-ethinyl estradiol and Penicillins Current Outpatient Medications on File Prior to Visit  Medication Sig Dispense Refill   Multiple Vitamin  (MULTIVITAMIN) tablet Take 1 tablet by mouth daily.     norethindrone (MICRONOR) 0.35 MG tablet Take 1 tablet (0.35 mg total) by mouth daily. 90 tablet 3   No current facility-administered medications on file prior to visit.    Social History   Tobacco Use   Smoking status: Never   Smokeless tobacco: Never  Vaping Use   Vaping Use: Never used  Substance Use Topics   Alcohol use: Yes    Alcohol/week: 8.0 standard drinks    Types: 8 Cans of beer per week    Comment: 2-3 beers per week, approx 2-3 occasions. Social   Drug use: No    Review of Systems  Constitutional:  Positive for fatigue. Negative for chills, fever and unexpected weight change.  Respiratory:  Negative for cough.   Cardiovascular:  Negative for chest pain and palpitations.  Gastrointestinal:  Negative for nausea and vomiting.  Psychiatric/Behavioral:  Negative for sleep disturbance. The patient is not nervous/anxious.      Objective:    BP 116/82 (BP Location: Left Arm, Patient Position: Sitting, Cuff Size: Large)   Pulse 76   Ht 5\' 11"  (1.803 m)   Wt 239 lb 12.8  oz (108.8 kg)   SpO2 98%   BMI 33.45 kg/m  BP Readings from Last 3 Encounters:  02/13/21 116/82  11/26/20 118/72  11/03/20 130/74   Wt Readings from Last 3 Encounters:  02/13/21 239 lb 12.8 oz (108.8 kg)  11/26/20 245 lb (111.1 kg)  11/03/20 247 lb 12.8 oz (112.4 kg)    Physical Exam Vitals reviewed.  Constitutional:      Appearance: She is well-developed.  Eyes:     Conjunctiva/sclera: Conjunctivae normal.  Neck:     Thyroid: No thyroid mass or thyromegaly.  Cardiovascular:     Rate and Rhythm: Normal rate and regular rhythm.     Pulses: Normal pulses.     Heart sounds: Normal heart sounds.  Pulmonary:     Effort: Pulmonary effort is normal.     Breath sounds: Normal breath sounds. No wheezing, rhonchi or rales.  Lymphadenopathy:     Head:     Right side of head: No submental, submandibular, tonsillar, preauricular, posterior  auricular or occipital adenopathy.     Left side of head: No submental, submandibular, tonsillar, preauricular, posterior auricular or occipital adenopathy.     Cervical: No cervical adenopathy.  Skin:    General: Skin is warm and dry.  Neurological:     Mental Status: She is alert.  Psychiatric:        Speech: Speech normal.        Behavior: Behavior normal.        Thought Content: Thought content normal.       Assessment & Plan:   Problem List Items Addressed This Visit       Cardiovascular and Mediastinum   HTN (hypertension)    Excellent control. Advised to may use hctz 12.5mg  prn for leg swelling while monitoring BP at home.        Relevant Medications   hydrochlorothiazide (HYDRODIURIL) 12.5 MG tablet     Respiratory   OSA (obstructive sleep apnea)    Diagnosed with moderate OSA 05/2020. Advised that fatigue likely multifactorial including untreated OSA.  she declines pursuing getting Cipap. She would like to focus on weight loss. Will continue to discuss.          Other   Menorrhagia with regular cycle    Menorrhagia resolved. Colonscopy UTD, Advised stool cards in setting of anemia, she declines. Suspect fatigue related to anemia.  Pending iron stores today to see if hematology referral warranted.        Relevant Orders   IBC + Ferritin   CBC with Differential/Platelet     I have changed Cherese Nunn's hydrochlorothiazide. I am also having her maintain her multivitamin and norethindrone.   Meds ordered this encounter  Medications   hydrochlorothiazide (HYDRODIURIL) 12.5 MG tablet    Sig: Take 1 tablet (12.5 mg total) by mouth daily.    Dispense:  90 tablet    Refill:  1    Order Specific Question:   Supervising Provider    Answer:   Sherlene Shams [[id number]]    Return precautions given.   Risks, benefits, and alternatives of the medications and treatment plan prescribed today were discussed, and patient expressed understanding.   Education  regarding symptom management and diagnosis given to patient on AVS.  Continue to follow with [name], FNP for routine health maintenance.   Brianna Wagner and I agreed with plan.   Rennie Plowman, FNP

## 2021-02-16 ENCOUNTER — Telehealth: Payer: Self-pay

## 2021-02-16 NOTE — Telephone Encounter (Signed)
LMTCB for lab results.  

## 2021-06-01 ENCOUNTER — Ambulatory Visit: Payer: Self-pay | Admitting: Family

## 2021-09-15 IMAGING — MG MM DIGITAL SCREENING BILAT W/ TOMO W/ CAD
8 of 14 series · 8 of 40 positions shown · non-contrast
Comparison: Previous exam(s).

CLINICAL DATA: Screening.

EXAM:
DIGITAL SCREENING BILATERAL MAMMOGRAM WITH TOMO AND CAD

[L MLO synth-2D]
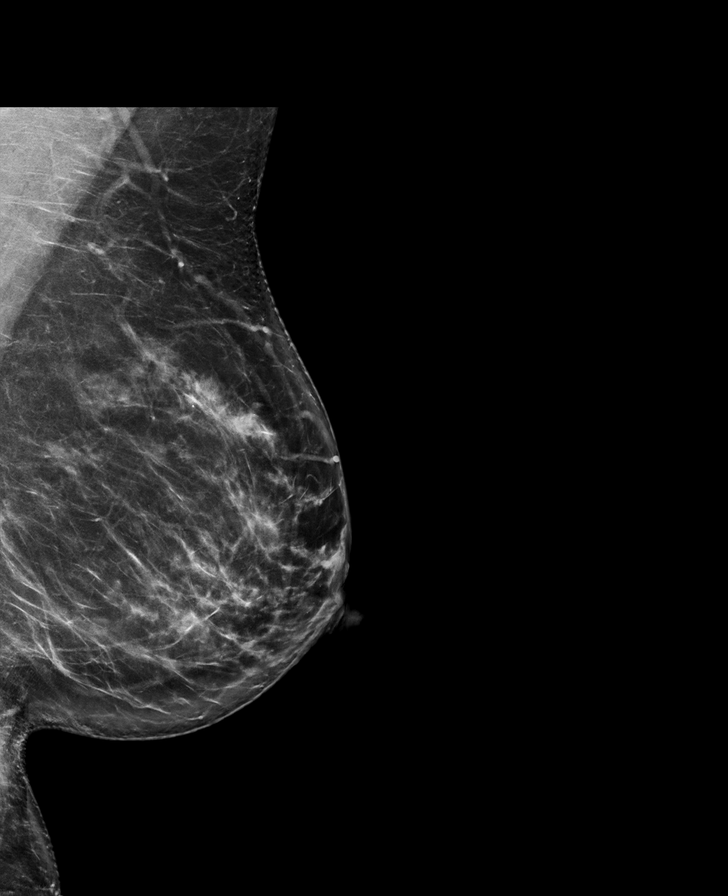

[R XCCL synth-2D]
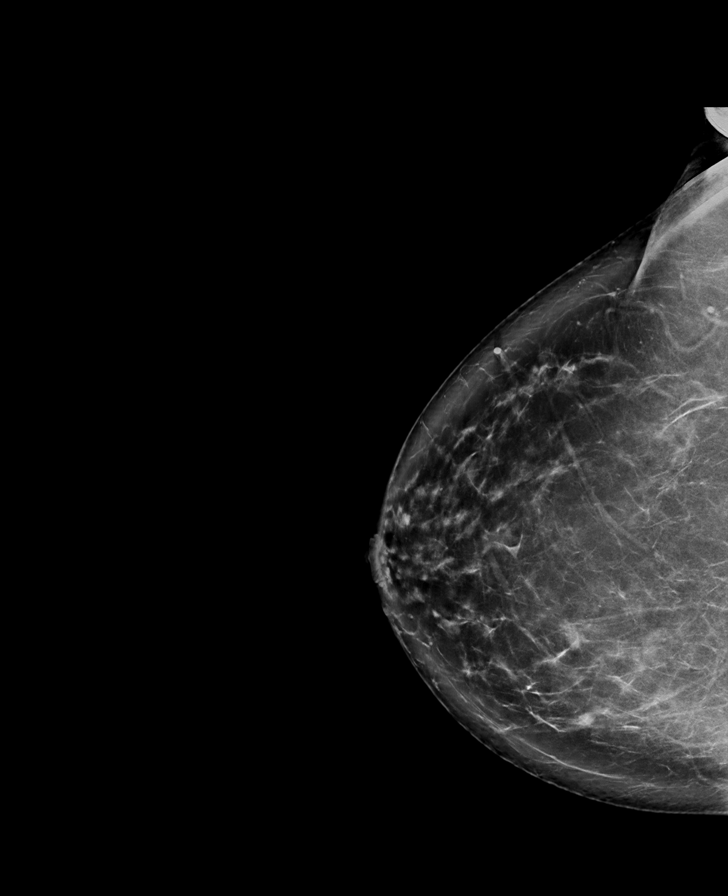

[R MLO synth-2D]
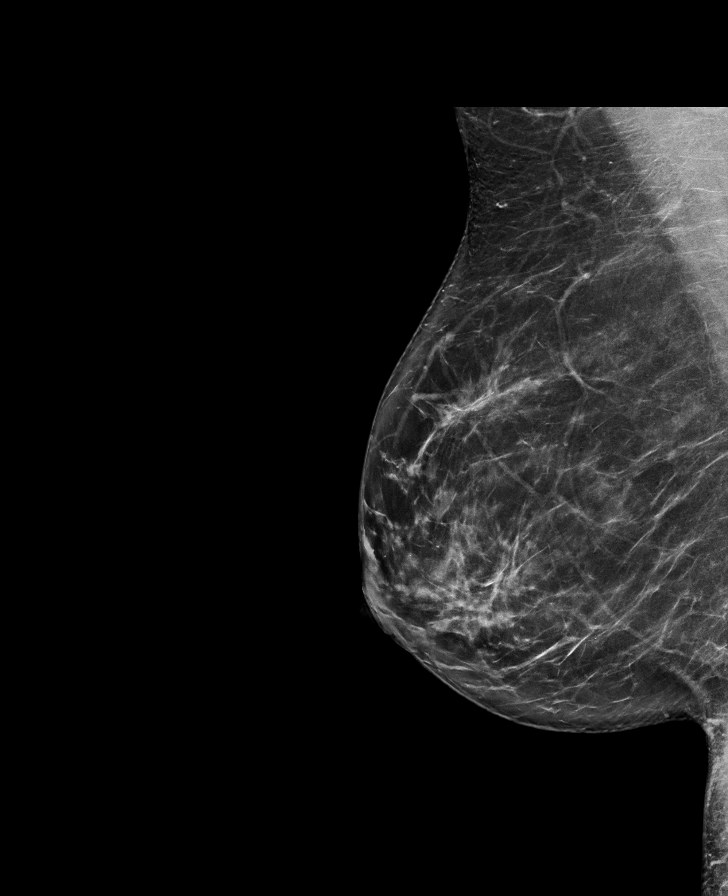

[L CC synth-2D]
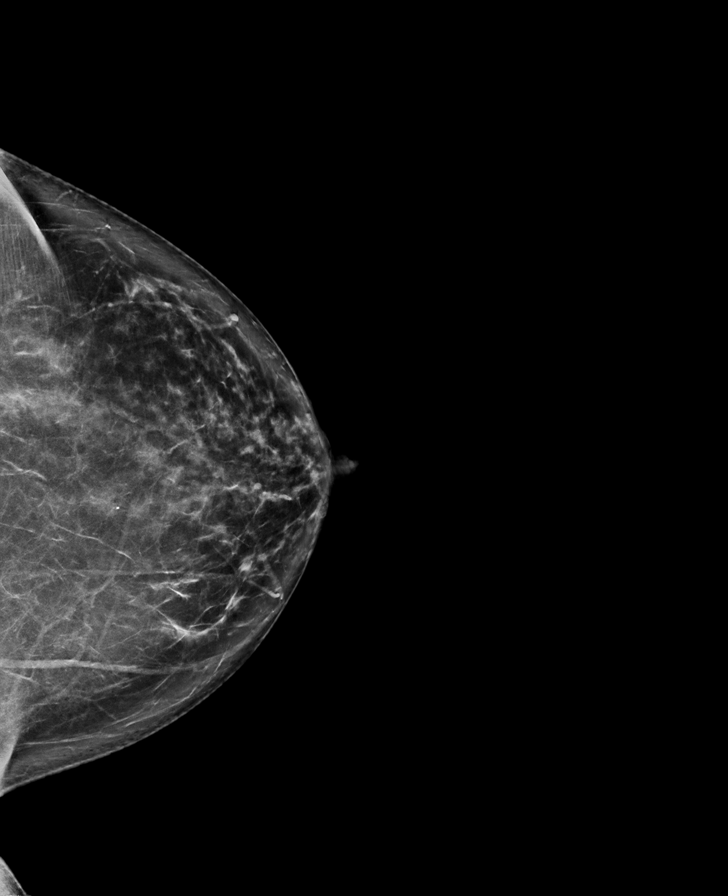

[L XCCL synth-2D]
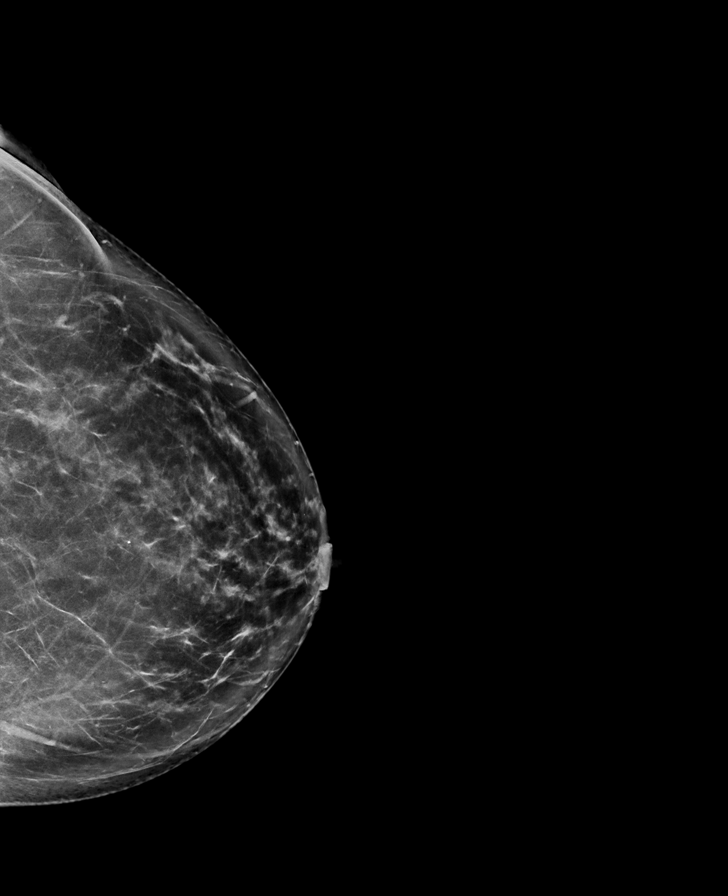

[R CC synth-2D (1 of 2)]
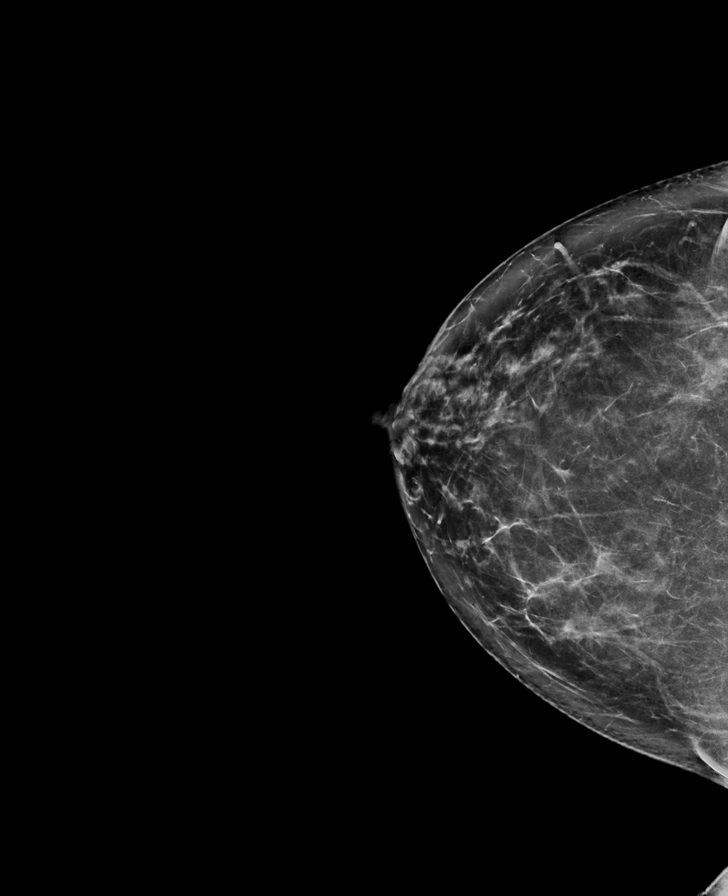

[R CC synth-2D (2 of 2)]
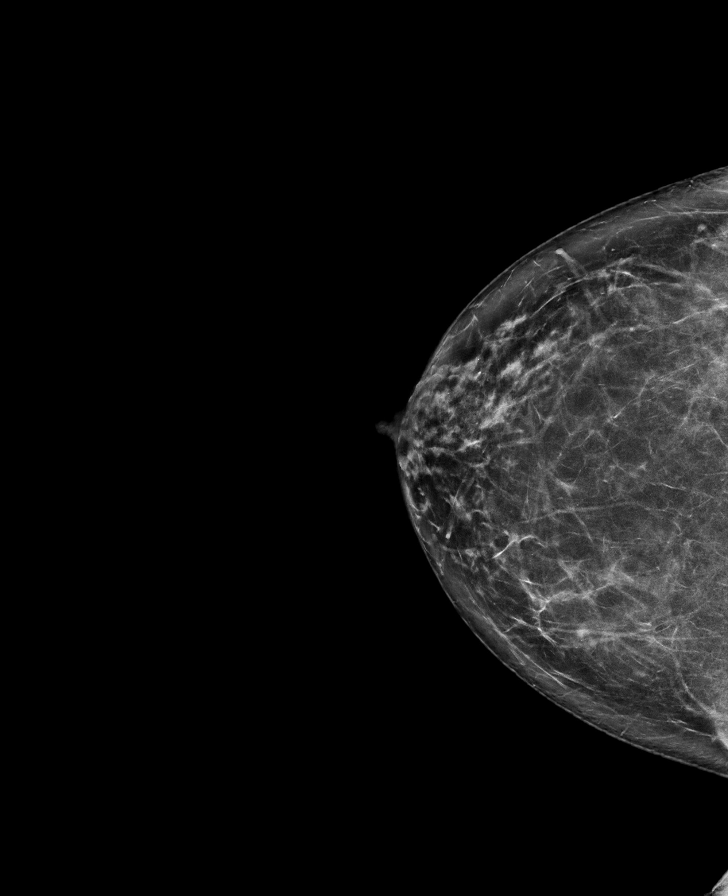

[R CC tomo · tomo slice 41/82.0]
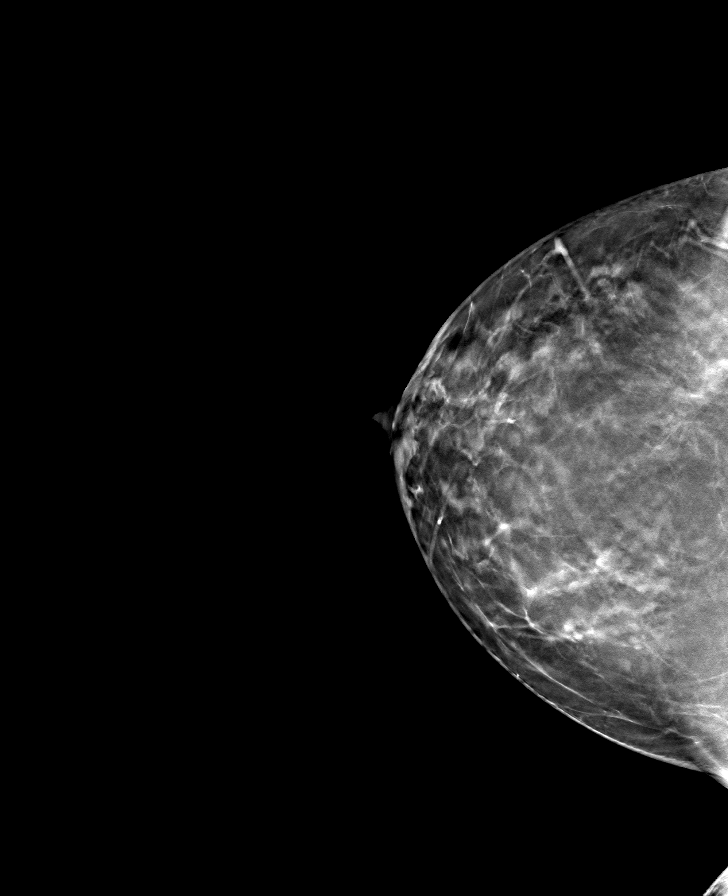

[8 of 40 positions shown; findings below may reference images not displayed]

ACR Breast Density Category b: There are scattered areas of
fibroglandular density.
FINDINGS: There are no findings suspicious for malignancy. Images were
processed with CAD.
IMPRESSION: No mammographic evidence of malignancy. A result letter of this
screening mammogram will be mailed directly to the patient.

RECOMMENDATION:
Screening mammogram in one year. (Code:[id number])

BI-RADS CATEGORY  1: Negative.

## 2021-11-24 ENCOUNTER — Telehealth: Payer: Self-pay | Admitting: Family

## 2021-11-24 NOTE — Telephone Encounter (Signed)
Requesting fasting labs before her physical on 5/31 ?

## 2021-11-25 ENCOUNTER — Other Ambulatory Visit: Payer: Self-pay

## 2021-11-25 DIAGNOSIS — Z0000 Encounter for general adult medical examination without abnormal findings: Secondary | ICD-10-CM

## 2021-11-25 NOTE — Telephone Encounter (Signed)
Labs are ordered for future 

## 2022-01-01 ENCOUNTER — Other Ambulatory Visit (INDEPENDENT_AMBULATORY_CARE_PROVIDER_SITE_OTHER): Payer: BC Managed Care – PPO

## 2022-01-01 DIAGNOSIS — Z0000 Encounter for general adult medical examination without abnormal findings: Secondary | ICD-10-CM

## 2022-01-02 LAB — CBC WITH DIFFERENTIAL/PLATELET
Absolute Monocytes: 446 cells/uL (ref 200–950)
Basophils Absolute: 59 cells/uL (ref 0–200)
Basophils Relative: 1.2 %
Eosinophils Absolute: 127 cells/uL (ref 15–500)
Eosinophils Relative: 2.6 %
HCT: 39.3 % (ref 35.0–45.0)
Hemoglobin: 13 g/dL (ref 11.7–15.5)
Lymphs Abs: 1299 cells/uL (ref 850–3900)
MCH: 30 pg (ref 27.0–33.0)
MCHC: 33.1 g/dL (ref 32.0–36.0)
MCV: 90.8 fL (ref 80.0–100.0)
MPV: 11.2 fL (ref 7.5–12.5)
Monocytes Relative: 9.1 %
Neutro Abs: 2969 cells/uL (ref 1500–7800)
Neutrophils Relative %: 60.6 %
Platelets: 277 10*3/uL (ref 140–400)
RBC: 4.33 10*6/uL (ref 3.80–5.10)
RDW: 13.1 % (ref 11.0–15.0)
Total Lymphocyte: 26.5 %
WBC: 4.9 10*3/uL (ref 3.8–10.8)

## 2022-01-02 LAB — COMPREHENSIVE METABOLIC PANEL
AG Ratio: 1.7 (calc) (ref 1.0–2.5)
ALT: 12 U/L (ref 6–29)
AST: 16 U/L (ref 10–35)
Albumin: 4.2 g/dL (ref 3.6–5.1)
Alkaline phosphatase (APISO): 66 U/L (ref 37–153)
BUN: 12 mg/dL (ref 7–25)
CO2: 27 mmol/L (ref 20–32)
Calcium: 9.5 mg/dL (ref 8.6–10.4)
Chloride: 101 mmol/L (ref 98–110)
Creat: 0.88 mg/dL (ref 0.50–1.03)
Globulin: 2.5 g/dL (calc) (ref 1.9–3.7)
Glucose, Bld: 85 mg/dL (ref 65–99)
Potassium: 4.4 mmol/L (ref 3.5–5.3)
Sodium: 139 mmol/L (ref 135–146)
Total Bilirubin: 0.8 mg/dL (ref 0.2–1.2)
Total Protein: 6.7 g/dL (ref 6.1–8.1)

## 2022-01-02 LAB — HEMOGLOBIN A1C
Hgb A1c MFr Bld: 5 % of total Hgb (ref <5.7)
Mean Plasma Glucose: 97 mg/dL
eAG (mmol/L): 5.4 mmol/L

## 2022-01-02 LAB — TSH: TSH: 2.56 mIU/L

## 2022-01-02 LAB — VITAMIN D 25 HYDROXY (VIT D DEFICIENCY, FRACTURES): Vit D, 25-Hydroxy: 62 ng/mL (ref 30–100)

## 2022-01-06 ENCOUNTER — Encounter: Payer: Self-pay | Admitting: Family

## 2022-01-06 ENCOUNTER — Ambulatory Visit (INDEPENDENT_AMBULATORY_CARE_PROVIDER_SITE_OTHER): Payer: BC Managed Care – PPO | Admitting: Family

## 2022-01-06 ENCOUNTER — Other Ambulatory Visit: Payer: Self-pay

## 2022-01-06 VITALS — BP 130/77 | HR 62 | Temp 98.0°F | Ht 71.0 in | Wt 256.1 lb

## 2022-01-06 DIAGNOSIS — I10 Essential (primary) hypertension: Secondary | ICD-10-CM

## 2022-01-06 DIAGNOSIS — Z1231 Encounter for screening mammogram for malignant neoplasm of breast: Secondary | ICD-10-CM

## 2022-01-06 DIAGNOSIS — Z0000 Encounter for general adult medical examination without abnormal findings: Secondary | ICD-10-CM | POA: Diagnosis not present

## 2022-01-06 NOTE — Assessment & Plan Note (Addendum)
Pap smear is due.  Patient declines having at this time.  She politely declines having clinical breast exam in the office today.   mammogram has been ordered.  Patient will schedule.  Encouraged starting exercise program.

## 2022-01-06 NOTE — Progress Notes (Signed)
Subjective:    Patient ID: Brianna Wagner, female    DOB: 20-Oct-1965, 56 y.o.   MRN: [id number]  CC: Brianna Wagner is a 56 y.o. female who presents today for physical exam.    HPI: Feels well today No new complaints  Vaginal bleeding has completely resolved.  No pelvic pain.  She is no longer on Micronor. She is also stopped taking hydrochlorothiazide.  No chest pain or shortness of breath.  She endorses weight gain.  No regular exercise.  She feels that she works a lot without any time to exercise  Continues to follow annually with dermatology   Colorectal Cancer Screening: UTD , repeat in 10 years Breast Cancer Screening: Mammogram due Cervical Cancer Screening: due; previously negative malignancy, HPV 09/23/2017;    Bone Health screening/DEXA for 65+: No increased fracture risk. Defer screening at this time.  Lung Cancer Screening: Doesn't have 20 year pack year history and age > 50 years yo 55 years        Tetanus - due, declines         Hepatitis C screening - Candidate for, declines  Labs: Screening labs done prior Exercise: No regular exercise.   Alcohol use: Occasional beer and has decreased to two days for week Smoking/tobacco use: Nonsmoker.     HISTORY:  Past Medical History:  Diagnosis Date   Frequent headaches    Hypertension     Past Surgical History:  Procedure Laterality Date   TOOTH EXTRACTION     WISDOM TOOTH EXTRACTION     Family History  Problem Relation Age of Onset   Cancer Mother 33       ?unsure if pancreatic cancer.    Hypertension Mother    Stroke Mother    Thyroid disease Mother    Heart disease Mother    Cancer Father    Heart attack Father        13 years of age   Melanoma Father        67 years   Stomach cancer Father    Heart disease Father    Cancer Sister 65       breast   Thyroid disease Sister    Thyroid disease Maternal Aunt    Breast cancer Sister 62      ALLERGIES: Desogestrel-ethinyl estradiol and  Penicillins  Current Outpatient Medications on File Prior to Visit  Medication Sig Dispense Refill   Multiple Vitamin (MULTIVITAMIN) tablet Take 1 tablet by mouth daily.     No current facility-administered medications on file prior to visit.    Social History   Tobacco Use   Smoking status: Never   Smokeless tobacco: Never  Vaping Use   Vaping Use: Never used  Substance Use Topics   Alcohol use: Yes    Alcohol/week: 8.0 standard drinks    Types: 8 Cans of beer per week    Comment: 2-3 beers per week, approx 2-3 occasions. Social   Drug use: No    Review of Systems  Constitutional:  Negative for chills, fever and unexpected weight change.  HENT:  Negative for congestion.   Respiratory:  Negative for cough.   Cardiovascular:  Negative for chest pain, palpitations and leg swelling.  Gastrointestinal:  Negative for nausea and vomiting.  Genitourinary:  Negative for menstrual problem, pelvic pain and vaginal bleeding.  Musculoskeletal:  Negative for arthralgias and myalgias.  Skin:  Negative for rash.  Neurological:  Negative for headaches.  Hematological:  Negative for adenopathy.  Psychiatric/Behavioral:  Negative for confusion.      Objective:    BP 130/77 (BP Location: Left Arm, Patient Position: Sitting, Cuff Size: Large)   Pulse 62   Temp 98 F (36.7 C) (Oral)   Ht 5\' 11"  (1.803 m)   Wt 256 lb 1.6 oz (116.2 kg)   LMP  (LMP Unknown)   SpO2 (!) 62%   BMI 35.72 kg/m   BP Readings from Last 3 Encounters:  01/06/22 130/77  02/13/21 116/82  11/26/20 118/72   Wt Readings from Last 3 Encounters:  01/06/22 256 lb 1.6 oz (116.2 kg)  02/13/21 239 lb 12.8 oz (108.8 kg)  11/26/20 245 lb (111.1 kg)    Physical Exam Vitals reviewed.  Constitutional:      Appearance: She is well-developed.  Eyes:     Conjunctiva/sclera: Conjunctivae normal.  Neck:     Thyroid: No thyroid mass or thyromegaly.  Cardiovascular:     Rate and Rhythm: Normal rate and regular rhythm.      Pulses: Normal pulses.     Heart sounds: Normal heart sounds.  Pulmonary:     Effort: Pulmonary effort is normal.     Breath sounds: Normal breath sounds. No wheezing, rhonchi or rales.  Lymphadenopathy:     Head:     Right side of head: No submental, submandibular, tonsillar, preauricular, posterior auricular or occipital adenopathy.     Left side of head: No submental, submandibular, tonsillar, preauricular, posterior auricular or occipital adenopathy.     Cervical: No cervical adenopathy.  Skin:    General: Skin is warm and dry.  Neurological:     Mental Status: She is alert.  Psychiatric:        Speech: Speech normal.        Behavior: Behavior normal.        Thought Content: Thought content normal.       Assessment & Plan:   Problem List Items Addressed This Visit       Cardiovascular and Mediastinum   HTN (hypertension)    Slightly elevated today.  Goal < 120/80. Discussed weight gain could be contributory.  Patient is no longer on hydrochlorothiazide and prefers to monitor blood pressure.          Other   Routine general medical examination at a health care facility    Pap smear is due.  Patient declines having at this time.  She politely declines having clinical breast exam in the office today.   mammogram has been ordered.  Patient will schedule.  Encouraged starting exercise program.       Other Visit Diagnoses     Encounter for screening mammogram for malignant neoplasm of breast    -  Primary        I have discontinued Brianna Wagner's norethindrone and hydrochlorothiazide. I am also having her maintain her multivitamin.   No orders of the defined types were placed in this encounter.   Return precautions given.   Risks, benefits, and alternatives of the medications and treatment plan prescribed today were discussed, and patient expressed understanding.   Education regarding symptom management and diagnosis given to patient on AVS.   Continue  to follow with 11/28/20, FNP for routine health maintenance.   Brianna Wagner and I agreed with plan.   Brianna Copper, FNP

## 2022-01-06 NOTE — Assessment & Plan Note (Signed)
Slightly elevated today.  Goal < 120/80. Discussed weight gain could be contributory.  Patient is no longer on hydrochlorothiazide and prefers to monitor blood pressure.

## 2022-01-06 NOTE — Progress Notes (Signed)
Ordered mammogram, but patient does not want to get pap today

## 2022-01-06 NOTE — Patient Instructions (Signed)
Please monitor your blood pressure.  Goal is less than 120/80. We have ordered your mammogram at [facility].  Please call to schedule this.  Health Maintenance for Postmenopausal Women Menopause is a normal process in which your ability to get pregnant comes to an end. This process happens slowly over many months or years, usually between the ages of 83 and 86. Menopause is complete when you have missed your menstrual period for 12 months. It is important to talk with your health care provider about some of the most common conditions that affect women after menopause (postmenopausal women). These include heart disease, cancer, and bone loss (osteoporosis). Adopting a healthy lifestyle and getting preventive care can help to promote your health and wellness. The actions you take can also lower your chances of developing some of these common conditions. What are the signs and symptoms of menopause? During menopause, you may have the following symptoms: Hot flashes. These can be moderate or severe. Night sweats. Decrease in sex drive. Mood swings. Headaches. Tiredness (fatigue). Irritability. Memory problems. Problems falling asleep or staying asleep. Talk with your health care provider about treatment options for your symptoms. Do I need hormone replacement therapy? Hormone replacement therapy is effective in treating symptoms that are caused by menopause, such as hot flashes and night sweats. Hormone replacement carries certain risks, especially as you become older. If you are thinking about using estrogen or estrogen with progestin, discuss the benefits and risks with your health care provider. How can I reduce my risk for heart disease and stroke? The risk of heart disease, heart attack, and stroke increases as you age. One of the causes may be a change in the body's hormones during menopause. This can affect how your body uses dietary fats, triglycerides, and cholesterol. Heart attack  and stroke are medical emergencies. There are many things that you can do to help prevent heart disease and stroke. Watch your blood pressure High blood pressure causes heart disease and increases the risk of stroke. This is more likely to develop in people who have high blood pressure readings or are overweight. Have your blood pressure checked: Every 3-5 years if you are 6-71 years of age. Every year if you are 87 years old or older. Eat a healthy diet  Eat a diet that includes plenty of vegetables, fruits, low-fat dairy products, and lean protein. Do not eat a lot of foods that are high in solid fats, added sugars, or sodium. Get regular exercise Get regular exercise. This is one of the most important things you can do for your health. Most adults should: Try to exercise for at least 150 minutes each week. The exercise should increase your heart rate and make you sweat (moderate-intensity exercise). Try to do strengthening exercises at least twice each week. Do these in addition to the moderate-intensity exercise. Spend less time sitting. Even light physical activity can be beneficial. Other tips Work with your health care provider to achieve or maintain a healthy weight. Do not use any products that contain nicotine or tobacco. These products include cigarettes, chewing tobacco, and vaping devices, such as e-cigarettes. If you need help quitting, ask your health care provider. Know your numbers. Ask your health care provider to check your cholesterol and your blood sugar (glucose). Continue to have your blood tested as directed by your health care provider. Do I need screening for cancer? Depending on your health history and family history, you may need to have cancer screenings at different stages of  your life. This may include screening for: Breast cancer. Cervical cancer. Lung cancer. Colorectal cancer. What is my risk for osteoporosis? After menopause, you may be at increased risk  for osteoporosis. Osteoporosis is a condition in which bone destruction happens more quickly than new bone creation. To help prevent osteoporosis or the bone fractures that can happen because of osteoporosis, you may take the following actions: If you are 29-88 years old, get at least 1,000 mg of calcium and at least 600 international units (IU) of vitamin D per day. If you are older than age 26 but younger than age 49, get at least 1,200 mg of calcium and at least 600 international units (IU) of vitamin D per day. If you are older than age 31, get at least 1,200 mg of calcium and at least 800 international units (IU) of vitamin D per day. Smoking and drinking excessive alcohol increase the risk of osteoporosis. Eat foods that are rich in calcium and vitamin D, and do weight-bearing exercises several times each week as directed by your health care provider. How does menopause affect my mental health? Depression may occur at any age, but it is more common as you become older. Common symptoms of depression include: Feeling depressed. Changes in sleep patterns. Changes in appetite or eating patterns. Feeling an overall lack of motivation or enjoyment of activities that you previously enjoyed. Frequent crying spells. Talk with your health care provider if you think that you are experiencing any of these symptoms. General instructions See your health care provider for regular wellness exams and vaccines. This may include: Scheduling regular health, dental, and eye exams. Getting and maintaining your vaccines. These include: Influenza vaccine. Get this vaccine each year before the flu season begins. Pneumonia vaccine. Shingles vaccine. Tetanus, diphtheria, and pertussis (Tdap) booster vaccine. Your health care provider may also recommend other immunizations. Tell your health care provider if you have ever been abused or do not feel safe at home. Summary Menopause is a normal process in which your  ability to get pregnant comes to an end. This condition causes hot flashes, night sweats, decreased interest in sex, mood swings, headaches, or lack of sleep. Treatment for this condition may include hormone replacement therapy. Take actions to keep yourself healthy, including exercising regularly, eating a healthy diet, watching your weight, and checking your blood pressure and blood sugar levels. Get screened for cancer and depression. Make sure that you are up to date with all your vaccines. This information is not intended to replace advice given to you by your health care provider. Make sure you discuss any questions you have with your health care provider. Document Revised: 12/15/2020 Document Reviewed: 12/15/2020 Elsevier Patient Education  2023 [employer].

## 2022-02-03 ENCOUNTER — Ambulatory Visit
Admission: RE | Admit: 2022-02-03 | Discharge: 2022-02-03 | Disposition: A | Discharge status: Home / Self Care | Payer: BC Managed Care – PPO | Source: Ambulatory Visit | Attending: Family | Admitting: Family

## 2022-02-03 DIAGNOSIS — Z1231 Encounter for screening mammogram for malignant neoplasm of breast: Secondary | ICD-10-CM

## 2022-11-17 ENCOUNTER — Ambulatory Visit: Payer: BC Managed Care – PPO | Admitting: Family

## 2022-11-17 ENCOUNTER — Encounter: Payer: Self-pay | Admitting: Family

## 2022-11-17 VITALS — BP 146/89 | HR 67 | Temp 97.8°F | Ht 71.0 in | Wt 249.4 lb

## 2022-11-17 DIAGNOSIS — D649 Anemia, unspecified: Secondary | ICD-10-CM

## 2022-11-17 DIAGNOSIS — Z13220 Encounter for screening for lipoid disorders: Secondary | ICD-10-CM

## 2022-11-17 DIAGNOSIS — G4733 Obstructive sleep apnea (adult) (pediatric): Secondary | ICD-10-CM | POA: Diagnosis not present

## 2022-11-17 DIAGNOSIS — I10 Essential (primary) hypertension: Secondary | ICD-10-CM | POA: Diagnosis not present

## 2022-11-17 DIAGNOSIS — R079 Chest pain, unspecified: Secondary | ICD-10-CM

## 2022-11-17 DIAGNOSIS — Z136 Encounter for screening for cardiovascular disorders: Secondary | ICD-10-CM

## 2022-11-17 MED ORDER — LOSARTAN POTASSIUM 50 MG PO TABS: 50.0000 mg | ORAL_TABLET | Freq: Every day | ORAL | 1 refills | Status: DC

## 2022-11-17 NOTE — Progress Notes (Signed)
Assessment & Plan:  Chest pain, unspecified type -     EKG 12-Lead -     DG Chest 2 View; Future -     Troponin I -; Future  OSA (obstructive sleep apnea) Assessment & Plan: Patient never received/ pursued a CPAP machine with pulmonology in 2021. Referral back to pulmonology and reiterated the importance of treating underlying OSA.  Counseled on risk MI, CVA, atrial fibrillation, chronic fatigue if untreated.  Referral to pulmonology has been placed today  Orders: -     Ambulatory referral to Pulmonology  Primary hypertension Assessment & Plan: Elevated.  Start losartan 50 mg.  Follow-up in 1 week  Orders: -     Comprehensive metabolic panel; Future -     TSH; Future  Encounter for lipid screening for cardiovascular disease -     Lipid panel; Future  Anemia, unspecified type -     CBC with Differential/Platelet; Future -     Iron, TIBC and Ferritin Panel; Future  Nonspecific chest pain Assessment & Plan: Nonexertional chest pain.  No chest pain on exam today.  Chest pain seems to occur with rest.   Patient does endorse increased anxiety.  Reviewed EKG from 11/02/2017 with Dr. Okey Dupre and no acute changes when compared to EKG today.  Stress test echo normal in 2019. Chest x-ray reveals atherosclerosis.  Family history of ASCVD in father age 48. advised cardiac evaluation and CT calcium score. Consider GERD if aggravating.    Other orders -     Losartan Potassium; Take 1 tablet (50 mg total) by mouth daily.  Dispense: 90 tablet; Refill: 1     Return precautions given.   Risks, benefits, and alternatives of the medications and treatment plan prescribed today were discussed, and patient expressed understanding.   Education regarding symptom management and diagnosis given to patient on AVS either electronically or printed.  Return in about 1 week (around 11/24/2022) for Fasting labs in 2-3 weeks.  Rennie Plowman, FNP  Subjective:    Patient ID: [name], female     DOB: 1966/02/04, 57 y.o.   MRN: [id number]  CC: Bobie Caris is a 57 y.o. female who presents today for follow up.   HPI: Complains of episodic chest pain which started one month ago.   Last episode of CP 4 days ago . Started in the morning at rest. She went to church and central CP persisted. She rested when she got home and after a few hours, pain went away.  She describes approx 12 episodes in the past month.   Describes as dull pressure. Pain is not sharp. She thinks stress contributory. She describes CP with coffee in the morning and then sometimes would she would feel CP as she wakes up in the morning She will have CP at night, while in bed.   She has sporadic cough due to allergies.   No CP with activity. No leg swelling,  belching, epigastric burning, fever, sob, ha, vision loss,  left arm numbness, palpitations, jaw pain.   Blood pressure at home have been elevated blood pressure, one reading of 180/104.   She has been on blood pressure medication in the past. She is using wrist monitor for BP .  No formal exercise. She walks on occasionally without CP.   H/o OSA however she never started a Cipap machine.       She had stress test 03/2018 with Dr End.   Moderate OSA 05/2020  Allergies: Desogestrel-ethinyl estradiol and Penicillins  Current Outpatient Medications on File Prior to Visit  Medication Sig Dispense Refill   Multiple Vitamin (MULTIVITAMIN) tablet Take 1 tablet by mouth daily.     No current facility-administered medications on file prior to visit.    Review of Systems  Constitutional:  Negative for chills and fever.  Respiratory:  Negative for cough and shortness of breath.   Cardiovascular:  Positive for chest pain. Negative for palpitations and leg swelling.  Gastrointestinal:  Negative for nausea and vomiting.      Objective:    BP (!) 146/89   Pulse 67   Temp 97.8 F (36.6 C) (Oral)   Ht 5\' 11"  (1.803 m)   Wt 249 lb 6.4 oz (113.1 kg)   LMP   (LMP Unknown)   SpO2 97%   BMI 34.78 kg/m  BP Readings from Last 3 Encounters:  11/17/22 (!) 146/89  01/06/22 130/77  02/13/21 116/82   Wt Readings from Last 3 Encounters:  11/17/22 249 lb 6.4 oz (113.1 kg)  01/06/22 256 lb 1.6 oz (116.2 kg)  02/13/21 239 lb 12.8 oz (108.8 kg)    Physical Exam Vitals reviewed.  Constitutional:      Appearance: She is well-developed.  Eyes:     Conjunctiva/sclera: Conjunctivae normal.  Cardiovascular:     Rate and Rhythm: Normal rate and regular rhythm.     Pulses: Normal pulses.     Heart sounds: Normal heart sounds.  Pulmonary:     Effort: Pulmonary effort is normal.     Breath sounds: Normal breath sounds. No wheezing, rhonchi or rales.  Chest:     Chest wall: No tenderness.     Comments: No reproducible chest pain.  No chest pain with movement of the shoulders.  Skin:    General: Skin is warm and dry.  Neurological:     Mental Status: She is alert.  Psychiatric:        Speech: Speech normal.        Behavior: Behavior normal.        Thought Content: Thought content normal.   I have spent 40 minutes with a patient including precharting, exam, reviewing medical records, and discussion plan of care.

## 2022-11-18 ENCOUNTER — Ambulatory Visit (INDEPENDENT_AMBULATORY_CARE_PROVIDER_SITE_OTHER): Payer: BC Managed Care – PPO

## 2022-11-18 ENCOUNTER — Other Ambulatory Visit (INDEPENDENT_AMBULATORY_CARE_PROVIDER_SITE_OTHER): Payer: BC Managed Care – PPO

## 2022-11-18 ENCOUNTER — Encounter: Payer: Self-pay | Admitting: Family

## 2022-11-18 DIAGNOSIS — R079 Chest pain, unspecified: Secondary | ICD-10-CM | POA: Diagnosis not present

## 2022-11-18 DIAGNOSIS — D649 Anemia, unspecified: Secondary | ICD-10-CM

## 2022-11-18 DIAGNOSIS — I10 Essential (primary) hypertension: Secondary | ICD-10-CM

## 2022-11-18 DIAGNOSIS — Z13220 Encounter for screening for lipoid disorders: Secondary | ICD-10-CM

## 2022-11-18 LAB — CBC WITH DIFFERENTIAL/PLATELET
Absolute Monocytes: 420 cells/uL (ref 200–950)
Basophils Absolute: 42 cells/uL (ref 0–200)
Basophils Relative: 1 %
Eosinophils Absolute: 109 cells/uL (ref 15–500)
Eosinophils Relative: 2.6 %
HCT: 40 % (ref 35.0–45.0)
Hemoglobin: 13.1 g/dL (ref 11.7–15.5)
Lymphs Abs: 1516 cells/uL (ref 850–3900)
MCH: 29.8 pg (ref 27.0–33.0)
MCHC: 32.8 g/dL (ref 32.0–36.0)
MCV: 91.1 fL (ref 80.0–100.0)
MPV: 10.5 fL (ref 7.5–12.5)
Monocytes Relative: 10 %
Neutro Abs: 2113 cells/uL (ref 1500–7800)
Neutrophils Relative %: 50.3 %
Platelets: 244 10*3/uL (ref 140–400)
RBC: 4.39 10*6/uL (ref 3.80–5.10)
RDW: 13.2 % (ref 11.0–15.0)
Total Lymphocyte: 36.1 %
WBC: 4.2 10*3/uL (ref 3.8–10.8)

## 2022-11-18 LAB — IRON,TIBC AND FERRITIN PANEL
%SAT: 22 % (calc) (ref 16–45)
Ferritin: 59 ng/mL (ref 16–232)
Iron: 71 ug/dL (ref 45–160)
TIBC: 327 mcg/dL (calc) (ref 250–450)

## 2022-11-18 LAB — COMPREHENSIVE METABOLIC PANEL
AG Ratio: 1.7 (calc) (ref 1.0–2.5)
ALT: 18 U/L (ref 6–29)
AST: 20 U/L (ref 10–35)
Albumin: 4.3 g/dL (ref 3.6–5.1)
Alkaline phosphatase (APISO): 75 U/L (ref 37–153)
BUN: 18 mg/dL (ref 7–25)
CO2: 28 mmol/L (ref 20–32)
Calcium: 9.6 mg/dL (ref 8.6–10.4)
Chloride: 103 mmol/L (ref 98–110)
Creat: 0.84 mg/dL (ref 0.50–1.03)
Globulin: 2.5 g/dL (calc) (ref 1.9–3.7)
Glucose, Bld: 90 mg/dL (ref 65–99)
Potassium: 4.2 mmol/L (ref 3.5–5.3)
Sodium: 139 mmol/L (ref 135–146)
Total Bilirubin: 0.6 mg/dL (ref 0.2–1.2)
Total Protein: 6.8 g/dL (ref 6.1–8.1)

## 2022-11-18 LAB — LIPID PANEL
Cholesterol: 143 mg/dL (ref <200)
HDL: 50 mg/dL (ref >=50)
LDL Cholesterol (Calc): 77 mg/dL (calc)
Non-HDL Cholesterol (Calc): 93 mg/dL (calc) (ref <130)
Total CHOL/HDL Ratio: 2.9 (calc) (ref <5.0)
Triglycerides: 79 mg/dL (ref <150)

## 2022-11-18 LAB — TSH: TSH: 1.79 mIU/L (ref 0.40–4.50)

## 2022-11-18 LAB — TROPONIN I: Troponin I: <3 ng/L (ref <=47)

## 2022-11-19 NOTE — Progress Notes (Signed)
Assessment & Plan:  Left-sided chest wall pain Assessment & Plan: Reproducible left-sided chest wall pain. She declines [place]. We discussed likely musculoskeletal etiology. Advised topical voltaren gel, heat/icing regimen. Counseling extensively in regard to cardiac/neurologic red flags which warrant immediate ER evaluation.    Atherosclerosis of aorta Assessment & Plan: She is overall low cardiovascular risk however family history ASCVD.  Discussed atherosclerosis.   The 10-year ASCVD risk score (Ledarius Leeson DK, et al., 2019) is: 2.4% Strongly advised cardiology consult.  Patient politely declines.  She is willing to have CT calcium score test obtained.  I have ordered this today  Orders: -     CT CARDIAC SCORING (SELF PAY ONLY); Future  Primary hypertension Assessment & Plan: Improved. Continue losartan 50mg  qd      Return precautions given.   Risks, benefits, and alternatives of the medications and treatment plan prescribed today were discussed, and patient expressed understanding.   Education regarding symptom management and diagnosis given to patient on AVS either electronically or printed.  Return in about 3 months (around 02/23/2023).  Rennie Plowman, FNP  Subjective:    Patient ID: Brianna Wagner, female    DOB: Jun 20, 1966, 57 y.o.   MRN: [id number]  CC: Brianna Wagner is a 57 y.o. female who presents today for follow up.   HPI: She is having  left sided CP today this morning. She finds herself rubbing and pressing on the area.It is one 'particular spot'   Endorses increased anxiety. 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  She has dull ache left side start prior to drinking coffee. Pain doesn't increase in intensity with coffee, eating.   No numbness or pain to left arm to jaw, n, diaphoresis, abdominal pain.    Mammogram utd. She has not felt a breast mass      She is  compliant with losartan 50 mg daily.    Evidence of aortic atherosclerosis on CXR.  CT calcium score to further stratify risk of heart disease, specifically coronary artery disease.   Family h/o ASCVD  Allergies: Desogestrel-ethinyl estradiol and Penicillins Current Outpatient Medications on File Prior to Visit  Medication Sig Dispense Refill   losartan (COZAAR) 50 MG tablet Take 1 tablet (50 mg total) by mouth daily. 90 tablet 1   Multiple Vitamin (MULTIVITAMIN) tablet Take 1 tablet by mouth daily.     No current facility-administered medications on file prior to visit.    Review of Systems  Constitutional:  Negative for chills, diaphoresis and fever.  Respiratory:  Negative for cough and shortness of breath.   Cardiovascular:  Positive for chest pain. Negative for palpitations and leg swelling.  Gastrointestinal:  Negative for nausea and vomiting.  Psychiatric/Behavioral:  The patient is nervous/anxious.       Objective:    BP 130/70   Pulse 77   Temp 97.9 F (36.6 C) (Oral)   Ht 5\' 11"  (1.803 m)   Wt 251 lb (113.9 kg)   LMP  (LMP Unknown)   SpO2 97%   BMI 35.01 kg/m  BP Readings from Last 3 Encounters:  11/24/22 130/70  11/17/22 (!) 146/89  01/06/22 130/77   Wt Readings from Last 3 Encounters:  11/24/22 251 lb (113.9 kg)  11/17/22 249 lb 6.4 oz (113.1 kg)  01/06/22 256 lb 1.6 oz (116.2 kg)    Physical Exam Vitals reviewed.  Constitutional:      Appearance: She is  well-developed.  Eyes:     Conjunctiva/sclera: Conjunctivae normal.  Cardiovascular:     Rate and Rhythm: Normal rate and regular rhythm.     Pulses: Normal pulses.     Heart sounds: Normal heart sounds.  Pulmonary:     Effort: Pulmonary effort is normal.     Breath sounds: Normal breath sounds. No wheezing, rhonchi or rales.  Chest:       Comments: Focal area of tenderness left sided approx 3cm  at midclavicular line with palpation. No mass, erythema, fluctuance.   No CP with movement of  either shoulder.   Skin:    General: Skin is warm and dry.  Neurological:     Mental Status: She is alert.  Psychiatric:        Speech: Speech normal.        Behavior: Behavior normal.        Thought Content: Thought content normal.

## 2022-11-20 ENCOUNTER — Encounter: Payer: Self-pay | Admitting: Family

## 2022-11-20 NOTE — Patient Instructions (Addendum)
Please stay incredibly vigilant for chest pain.  Any recurrence of to the episode as you described on Sunday, please call 911 or report to nearest emergency room.  Please start losartan 50 mg and monitor blood pressure.  Look forward to seeing you next week however please let me know if any acute concerns prior.    Nonspecific Chest Pain Chest pain can be caused by many different conditions. Some causes of chest pain can be life-threatening. These will require treatment right away. Serious causes of chest pain include: Heart attack. A tear in the body's main blood vessel. Redness and swelling (inflammation) around your heart. Blood clot in your lungs. Other causes of chest pain may not be so serious. These include: Heartburn. Anxiety or stress. Damage to bones or muscles in your chest. Lung infections. Chest pain can feel like: Pain or discomfort in your chest. Crushing, pressure, aching, or squeezing pain. Burning or tingling. Dull or sharp pain that is worse when you move, cough, or take a deep breath. Pain or discomfort that is also felt in your back, neck, jaw, shoulder, or arm, or pain that spreads to any of these areas. It is hard to know whether your pain is caused by something that is serious or something that is not so serious. So it is important to see your doctor right away if you have chest pain. Follow these instructions at home: Medicines Take over-the-counter and prescription medicines only as told by your doctor. If you were prescribed an antibiotic medicine, take it as told by your doctor. Do not stop taking the antibiotic even if you start to feel better. Lifestyle  Rest as told by your doctor. Do not use any products that contain nicotine or tobacco, such as cigarettes, e-cigarettes, and chewing tobacco. If you need help quitting, ask your doctor. Do not drink alcohol. Make lifestyle changes as told by your doctor. These may include: Getting regular exercise. Ask  your doctor what activities are safe for you. Eating a heart-healthy diet. A diet and nutrition specialist (dietitian) can help you to learn healthy eating options. Staying at a healthy weight. Treating diabetes or high blood pressure, if needed. Lowering your stress. Activities such as yoga and relaxation techniques can help. General instructions Pay attention to any changes in your symptoms. Tell your doctor about them or any new symptoms. Avoid any activities that cause chest pain. Keep all follow-up visits as told by your doctor. This is important. You may need more testing if your chest pain does not go away. Contact a doctor if: Your chest pain does not go away. You feel depressed. You have a fever. Get help right away if: Your chest pain is worse. You have a cough that gets worse, or you cough up blood. You have very bad (severe) pain in your belly (abdomen). You pass out (faint). You have either of these for no clear reason: Sudden chest discomfort. Sudden discomfort in your arms, back, neck, or jaw. You have shortness of breath at any time. You suddenly start to sweat, or your skin gets clammy. You feel sick to your stomach (nauseous). You throw up (vomit). You suddenly feel lightheaded or dizzy. You feel very weak or tired. Your heart starts to beat fast, or it feels like it is skipping beats. These symptoms may be an emergency. Do not wait to see if the symptoms will go away. Get medical help right away. Call your local emergency services (911 in the [place] Do not drive yourself  to the hospital. Summary Chest pain can be caused by many different conditions. The cause may be serious and need treatment right away. If you have chest pain, see your doctor right away. Follow your doctor's instructions for taking medicines and making lifestyle changes. Keep all follow-up visits as told by your doctor. This includes visits for any further testing if your chest pain does not go  away. Be sure to know the signs that show that your condition has become worse. Get help right away if you have these symptoms. This information is not intended to replace advice given to you by your health care provider. Make sure you discuss any questions you have with your health care provider. Document Revised: 06/10/2022 Document Reviewed: 06/10/2022 Elsevier Patient Education  2023 [employer].

## 2022-11-20 NOTE — Assessment & Plan Note (Addendum)
Nonexertional chest pain.  No chest pain on exam today.  Chest pain seems to occur with rest.   Patient does endorse increased anxiety.  Reviewed EKG from 11/02/2017 with Dr. Okey Dupre and no acute changes when compared to EKG today.  Stress test echo normal in 2019. Chest x-ray reveals atherosclerosis.  Family history of ASCVD in father age 56. advised cardiac evaluation and CT calcium score. Consider GERD if aggravating.

## 2022-11-20 NOTE — Assessment & Plan Note (Signed)
Elevated.  Start losartan 50 mg.  Follow-up in 1 week

## 2022-11-20 NOTE — Assessment & Plan Note (Signed)
Patient never received/ pursued a CPAP machine with pulmonology in 2021. Referral back to pulmonology and reiterated the importance of treating underlying OSA.  Counseled on risk MI, CVA, atrial fibrillation, chronic fatigue if untreated.  Referral to pulmonology has been placed today

## 2022-11-24 ENCOUNTER — Encounter: Payer: Self-pay | Admitting: Family

## 2022-11-24 ENCOUNTER — Ambulatory Visit: Payer: BC Managed Care – PPO | Admitting: Family

## 2022-11-24 VITALS — BP 130/70 | HR 77 | Temp 97.9°F | Ht 71.0 in | Wt 251.0 lb

## 2022-11-24 DIAGNOSIS — I10 Essential (primary) hypertension: Secondary | ICD-10-CM | POA: Diagnosis not present

## 2022-11-24 DIAGNOSIS — I700 Atherosclerosis of aorta: Secondary | ICD-10-CM | POA: Diagnosis not present

## 2022-11-24 DIAGNOSIS — R0789 Other chest pain: Secondary | ICD-10-CM | POA: Diagnosis not present

## 2022-11-24 NOTE — Patient Instructions (Addendum)
Question if left-sided chest wall pain is musculoskeletal in etiology.  I was able to reproduce this pain on exam today.  You may look up costochondritis which is inflammation of the cartilage in between the ribs.  As discussed today, please trial heat or ice in that area to see if it improves.  You may also try over-the-counter Voltaren gel.  Please send me a note and let me know how you are doing.  Of course if chest pain or change in any way, please stay vigilant and have no hesitation for calling 911 or going to emergency room. She finds herself rubbing I have ordered CT calcium score ( SELF PAY option)  to further stratify your overall cardiovascular risk .   An estimate of cost is $150-200 out-of-pocket as not covered by insurance. However please call your insurance company in advance to ensure no other costs so that you do not have any unexpected bills.     In [facility], It can be performed at any of these 3 locations (outpatient imaging center Administrator, sports) at [address]/ [employer], [facility] or [place] outpatient center)   I have placed your order to [facility] in [place] as  generally most convenient.   Phone Number to [facility] on [address] is is [phone] to get scheduled.   Please call to get scheduled and if any issues at all in doing so, please let me know.   Below an article from [facility] regarding the test.   [email or web address].   Exams We Offer: Cardiac CT Calcium Score  Knowing your score could save your life. A cardiac CT calcium score, also known as a coronary calcium scan, is a quick, convenient and noninvasive way of evaluating the amount of calcified (hard) plaque in your heart vessels. The level of calcium equates to the extent of plaque build-up in your arteries. Plaque in the arteries  can cause heart attacks.  The radiologist reads the images and sends your doctor a report with a calcium score. Patients with higher scores have a greater risk for a heart attack, heart disease or stroke. Knowing your score can help your doctor decide on blood pressure and cholesterol goals that will minimize your risk as much as possible.  The [employer] of Cardiology found that Coronary artery calcification (CAC) is an excellent cardiovascular disease risk marker and can help guide the decision to use cholesterol reducing medications such as statins. A negative calcium score may reduce the need for statins in otherwise eligible patients.  The exam takes less than 10 minutes, is painless and does not require any IV or oral contrast. At [facility] locations, the out-of-pocket fee without insurance is $75. At the time of scheduling, please let us know if you want to process the exam through your insurance or self-pay at the rate of $75. Patients who want to self-pay should not submit their insurance card when checking in for the appointment.   Who should get a Cardiac CT Calcium Score: Middle age adults at intermediate risk of heart disease Family history of heart disease Borderline high cholesterol, high blood pressure or diabetes Overweight or physical inactivity Uncertain about taking daily preventive medical therapy

## 2022-11-24 NOTE — Assessment & Plan Note (Addendum)
Reproducible left-sided chest wall pain. She declines [place]. We discussed likely musculoskeletal etiology. Advised topical voltaren gel, heat/icing regimen. Counseling extensively in regard to cardiac/neurologic red flags which warrant immediate ER evaluation.

## 2022-11-24 NOTE — Assessment & Plan Note (Addendum)
She is overall low cardiovascular risk however family history ASCVD.  Discussed atherosclerosis.   The 10-year ASCVD risk score (Honour Schwieger DK, et al., 2019) is: 2.4% Strongly advised cardiology consult.  Patient politely declines.  She is willing to have CT calcium score test obtained.  I have ordered this today

## 2022-11-26 NOTE — Assessment & Plan Note (Signed)
Improved. Continue losartan  qd

## 2022-11-29 NOTE — Addendum Note (Signed)
Addended by: [name] on: 11/29/2022 01:05 PM   Modules accepted: Level of Service

## 2022-12-16 NOTE — Addendum Note (Signed)
Addended by: [name] on: 12/16/2022 12:13 PM   Modules accepted: Level of Service

## 2023-01-04 ENCOUNTER — Encounter: Payer: Self-pay | Admitting: Primary Care

## 2023-01-04 ENCOUNTER — Ambulatory Visit: Payer: BC Managed Care – PPO | Admitting: Primary Care

## 2023-01-04 VITALS — BP 128/78 | HR 60 | Temp 97.8°F | Ht 71.0 in | Wt 257.0 lb

## 2023-01-04 DIAGNOSIS — G4733 Obstructive sleep apnea (adult) (pediatric): Secondary | ICD-10-CM | POA: Diagnosis not present

## 2023-01-04 NOTE — Assessment & Plan Note (Addendum)
-   HST on 06/07/20 showed moderate OSA, AHI 15.5/hour. She was never started on CPAP therapy. She has had no significant weight changes. Patient continues to have symptoms of snoring and daytime fatigue. Reviewed sleep study results and treatment options. She is open to trial CPAP. DME order placed to be started on auto CPAP 5-15cm h20with mask of choice. Advised patient wear CPAP nightly 4-6 hours or longer. Work on weight loss and focus on side sleeping position. Avoid alcohol or sedative prior to bedtime. FU 31-90 days after starting CPAP for compliance check.

## 2023-01-04 NOTE — Patient Instructions (Addendum)
Recommendations: Aim to wear CPAP nightly 4-6 hours a night  Orders: Auto CPAP 5-15cm h20 with mask of choice  Follow-up: Please schedule follow-up 31-90 days after starting CPAP for compliance check  CPAP and BIPAP Information CPAP and BIPAP are methods that use air pressure to keep your airways open and to help you breathe well. CPAP and BIPAP use different amounts of pressure. Your health care provider will tell you whether CPAP or BIPAP would be more helpful for you. CPAP stands for "continuous positive airway pressure." With CPAP, the amount of pressure stays the same while you breathe in (inhale) and out (exhale). BIPAP stands for "bi-level positive airway pressure." With BIPAP, the amount of pressure will be higher when you inhale and lower when you exhale. This allows you to take larger breaths. CPAP or BIPAP may be used in the hospital, or your health care provider may want you to use it at home. You may need to have a sleep study before your health care provider can order a machine for you to use at home. What are the advantages? CPAP or BIPAP can be helpful if you have: Sleep apnea. Chronic obstructive pulmonary disease (COPD). Heart failure. Medical conditions that cause muscle weakness, including muscular dystrophy or amyotrophic lateral sclerosis (ALS). Other problems that cause breathing to be shallow, weak, abnormal, or difficult. CPAP and BIPAP are most commonly used for obstructive sleep apnea (OSA) to keep the airways from collapsing when the muscles relax during sleep. What are the risks? Generally, this is a safe treatment. However, problems may occur, including: Irritated skin or skin sores if the mask does not fit properly. Dry or stuffy nose or nosebleeds. Dry mouth. Feeling gassy or bloated. Sinus or lung infection if the equipment is not cleaned properly. When should CPAP or BIPAP be used? In most cases, the mask only needs to be worn during sleep. Generally,  the mask needs to be worn throughout the night and during any daytime naps. People with certain medical conditions may also need to wear the mask at other times, such as when they are awake. Follow instructions from your health care provider about when to use the machine. What happens during CPAP or BIPAP?  Both CPAP and BIPAP are provided by a small machine with a flexible plastic tube that attaches to a plastic mask that you wear. Air is blown through the mask into your nose or mouth. The amount of pressure that is used to blow the air can be adjusted on the machine. Your health care provider will set the pressure setting and help you find the best mask for you. Tips for using the mask Because the mask needs to be snug, some people feel trapped or closed-in (claustrophobic) when first using the mask. If you feel this way, you may need to get used to the mask. One way to do this is to hold the mask loosely over your nose or mouth and then gradually apply the mask more snugly. You can also gradually increase the amount of time that you use the mask. Masks are available in various types and sizes. If your mask does not fit well, talk with your health care provider about getting a different one. Some common types of masks include: Full face masks, which fit over the mouth and nose. Nasal masks, which fit over the nose. Nasal pillow or prong masks, which fit into the nostrils. If you are using a mask that fits over your nose and you tend  to breathe through your mouth, a chin strap may be applied to help keep your mouth closed. Use a skin barrier to protect your skin as told by your health care provider. Some CPAP and BIPAP machines have alarms that may sound if the mask comes off or develops a leak. If you have trouble with the mask, it is very important that you talk with your health care provider about finding a way to make the mask easier to tolerate. Do not stop using the mask. There could be a  negative impact on your health if you stop using the mask. Tips for using the machine Place your CPAP or BIPAP machine on a secure table or stand near an electrical outlet. Know where the on/off switch is on the machine. Follow instructions from your health care provider about how to set the pressure on your machine and when you should use it. Do not eat or drink while the CPAP or BIPAP machine is on. Food or fluids could get pushed into your lungs by the pressure of the CPAP or BIPAP. For home use, CPAP and BIPAP machines can be rented or purchased through home health care companies. Many different brands of machines are available. Renting a machine before purchasing may help you find out which particular machine works well for you. Your health insurance company may also decide which machine you may get. Keep the CPAP or BIPAP machine and attachments clean. Ask your health care provider for specific instructions. Check the humidifier if you have a dry stuffy nose or nosebleeds. Make sure it is working correctly. Follow these instructions at home: Take over-the-counter and prescription medicines only as told by your health care provider. Ask if you can take sinus medicine if your sinuses are blocked. Do not use any products that contain nicotine or tobacco. These products include cigarettes, chewing tobacco, and vaping devices, such as e-cigarettes. If you need help quitting, ask your health care provider. Keep all follow-up visits. This is important. Contact a health care provider if: You have redness or pressure sores on your head, face, mouth, or nose from the mask or head gear. You have trouble using the CPAP or BIPAP machine. You cannot tolerate wearing the CPAP or BIPAP mask. Someone tells you that you snore even when wearing your CPAP or BIPAP. Get help right away if: You have trouble breathing. You feel confused. Summary CPAP and BIPAP are methods that use air pressure to keep your  airways open and to help you breathe well. If you have trouble with the mask, it is very important that you talk with your health care provider about finding a way to make the mask easier to tolerate. Do not stop using the mask. There could be a negative impact to your health if you stop using the mask. Follow instructions from your health care provider about when to use the machine. This information is not intended to replace advice given to you by your health care provider. Make sure you discuss any questions you have with your health care provider. Document Revised: 03/04/2021 Document Reviewed: 07/04/2020 Elsevier Patient Education  2023 [employer].

## 2023-01-04 NOTE — Progress Notes (Signed)
@Patient  ID: Brianna Wagner, female    DOB: 1966-06-26, 57 y.o.   MRN: [id number]  Chief Complaint  Patient presents with   Follow-up    Review HST.     Referring provider: [name], FNP  HPI: 57 year old female, never smoked.  Past medical history significant for hypertension, thyroid nodule, fatigue, overweight. Patient was referred by family nurse practitioner for sleep consult.  Previous [facility] pulmonary encounter:  05/05/2020 Patient presents today for sleep consult. She reports symptoms of restless sleep, excessive daytime fatigue and snoring for several years. These symptoms have become more noticeable recently. She has gained +30lbs in the past 2 years.  It takes her a few minutes to fall asleep at night. She wakes up 1-2 times to use the restroom. She has never had a sleep study or worn CPAP before. No family hx of sleep apnea.   Sleep questionnaire  Symptoms: Restless sleep, excessive daytime somnolence, snoring Epworth score: 5 Typical bedtime: In bed at 10pm Waketime: Starts day at 5:30am Nocturnal awakenings: 1-2 times   01/04/2023 Patient presents today for overdue follow-up OSA. She was seen for initial sleep consult in September 2021 for snoring symptoms. She had associated restless sleep and daytime sleepiness. HST on 10/30/2 showed moderate OSA, AHI 15.5/hour with spo2 low 82% (baseline 95%). We were unable to reach her to review results. She is here today to review sleep. She continues to have snoring symptoms and fatigue. We reviewed sleep study results and treatment options.  She is open to both oral appliance or CPAP for treatment of moderate OSA. Due to fatigue symptoms and cost, she has elected to trial CPAP therapy first.    Allergies  Allergen Reactions   Desogestrel-Ethinyl Estradiol Other (See Comments)    fatigue   Penicillins     Immunization History  Administered Date(s) Administered   Janssen (J&J) SARS-COV-2 Vaccination 05/08/2020    Past  Medical History:  Diagnosis Date   Frequent headaches    Hypertension     Tobacco History: Social History   Tobacco Use  Smoking Status Never  Smokeless Tobacco Never   Counseling given: Not Answered   Outpatient Medications Prior to Visit  Medication Sig Dispense Refill   losartan (COZAAR) 50 MG tablet Take 1 tablet (50 mg total) by mouth daily. 90 tablet 1   Multiple Vitamin (MULTIVITAMIN) tablet Take 1 tablet by mouth daily.     No facility-administered medications prior to visit.    Review of Systems  Review of Systems  Constitutional:  Positive for fatigue.  HENT: Negative.    Psychiatric/Behavioral:  Positive for sleep disturbance.     Physical Exam  BP 128/78 (BP Location: Left Arm, Cuff Size: Normal)   Pulse 60   Temp 97.8 F (36.6 C) (Temporal)   Ht 5\' 11"  (1.803 m)   Wt 257 lb (116.6 kg)   SpO2 100%   BMI 35.84 kg/m  Physical Exam Constitutional:      General: She is not in acute distress.    Appearance: Normal appearance. She is not ill-appearing.  HENT:     Head: Normocephalic and atraumatic.  Cardiovascular:     Rate and Rhythm: Normal rate and regular rhythm.  Pulmonary:     Effort: Pulmonary effort is normal.     Breath sounds: Normal breath sounds.  Skin:    General: Skin is warm and dry.  Neurological:     General: No focal deficit present.     Mental Status: She is  alert and oriented to person, place, and time. Mental status is at baseline.  Psychiatric:        Mood and Affect: Mood normal.        Behavior: Behavior normal.        Thought Content: Thought content normal.        Judgment: Judgment normal.      Lab Results:  CBC    Component Value Date/Time   WBC 4.2 11/18/2022 1042   RBC 4.39 11/18/2022 1042   HGB 13.1 11/18/2022 1042   HGB 8.9 (L) 11/26/2020 1636   HCT 40.0 11/18/2022 1042   HCT 28.8 (L) 11/26/2020 1636   PLT 244 11/18/2022 1042   PLT 293 11/26/2020 1636   MCV 91.1 11/18/2022 1042   MCV 83 11/26/2020  1636   MCH 29.8 11/18/2022 1042   MCHC 32.8 11/18/2022 1042   RDW 13.2 11/18/2022 1042   RDW 14.4 11/26/2020 1636   LYMPHSABS 1,516 11/18/2022 1042   LYMPHSABS 1.6 11/26/2020 1636   MONOABS 0.4 02/13/2021 0935   EOSABS 109 11/18/2022 1042   EOSABS 0.2 11/26/2020 1636   BASOSABS 42 11/18/2022 1042   BASOSABS 0.1 11/26/2020 1636    BMET    Component Value Date/Time   NA 139 11/18/2022 1042   K 4.2 11/18/2022 1042   CL 103 11/18/2022 1042   CO2 28 11/18/2022 1042   GLUCOSE 90 11/18/2022 1042   BUN 18 11/18/2022 1042   CREATININE 0.84 11/18/2022 1042   CALCIUM 9.6 11/18/2022 1042    BNP No results found for: "BNP"  ProBNP No results found for: "PROBNP"  Imaging: No results found.   Assessment & Plan:   OSA (obstructive sleep apnea) - HST on 06/07/20 showed moderate OSA, AHI 15.5/hour. She was never started on CPAP therapy. She has had no significant weight changes. Patient continues to have symptoms of snoring and daytime fatigue. Reviewed sleep study results and treatment options. She is open to trial CPAP. DME order placed to be started on auto CPAP 5-15cm h20with mask of choice. Advised patient wear CPAP nightly 4-6 hours or longer. Work on weight loss and focus on side sleeping position. Avoid alcohol or sedative prior to bedtime. FU 31-90 days after starting CPAP for compliance check.    Glenford Bayley, NP 01/04/2023

## 2023-01-04 NOTE — Progress Notes (Signed)
Reviewed and agree with assessment/plan.   Coralyn Helling, MD [facility] Pulmonary/Critical Care 01/04/2023, 3:58 PM Pager:  [phone]

## 2023-01-13 DIAGNOSIS — G4733 Obstructive sleep apnea (adult) (pediatric): Secondary | ICD-10-CM

## 2023-01-14 NOTE — Telephone Encounter (Signed)
Yes

## 2023-01-14 NOTE — Telephone Encounter (Signed)
Ok to order a new sleep study?

## 2023-01-20 ENCOUNTER — Ambulatory Visit
Admission: RE | Admit: 2023-01-20 | Discharge: 2023-01-20 | Disposition: A | Discharge status: Home / Self Care | Payer: BC Managed Care – PPO | Source: Ambulatory Visit | Attending: Family | Admitting: Family

## 2023-01-20 DIAGNOSIS — I700 Atherosclerosis of aorta: Secondary | ICD-10-CM | POA: Insufficient documentation

## 2023-02-23 ENCOUNTER — Ambulatory Visit: Payer: BC Managed Care – PPO | Admitting: Family

## 2023-03-01 ENCOUNTER — Ambulatory Visit: Payer: BC Managed Care – PPO | Admitting: Family

## 2023-03-01 ENCOUNTER — Encounter: Payer: Self-pay | Admitting: Family

## 2023-03-01 VITALS — BP 126/76 | HR 75 | Temp 97.7°F | Ht 71.0 in | Wt 252.6 lb

## 2023-03-01 DIAGNOSIS — I10 Essential (primary) hypertension: Secondary | ICD-10-CM | POA: Diagnosis not present

## 2023-03-01 DIAGNOSIS — R232 Flushing: Secondary | ICD-10-CM | POA: Diagnosis not present

## 2023-03-01 MED ORDER — GABAPENTIN 100 MG PO CAPS: 100.0000 mg | ORAL_CAPSULE | Freq: Three times a day (TID) | ORAL | 3 refills | Status: DC

## 2023-03-01 MED ORDER — LOSARTAN POTASSIUM 50 MG PO TABS: 50.0000 mg | ORAL_TABLET | Freq: Every day | ORAL | 3 refills | Status: DC

## 2023-03-01 NOTE — Assessment & Plan Note (Signed)
Chronic, stable.continue losartan 50mg  qd

## 2023-03-01 NOTE — Patient Instructions (Addendum)
Start gabapentin at bedtime ONLY and advance slowly over the course of days, weeks to three times daily if needed.  Please let me know how you are doing

## 2023-03-01 NOTE — Assessment & Plan Note (Signed)
No alarm features at this time.  Symptoms most consistent with menopause.  Discussed Paxil.  We also discussed gabapentin and patient is more inclined to start gabapentin and slowly titrate.  Start gabapentin 100 mg nightly and slowly titrate up to gabapentin 100 3 times daily.  Counseled on side effects of sedation.  Patient let me know how she is doing

## 2023-03-01 NOTE — Progress Notes (Signed)
Assessment & Plan:  Hot flashes Assessment & Plan: No alarm features at this time.  Symptoms most consistent with menopause.  Discussed Paxil.  We also discussed gabapentin and patient is more inclined to start gabapentin and slowly titrate.  Start gabapentin 100 mg nightly and slowly titrate up to gabapentin 100 3 times daily.  Counseled on side effects of sedation.  Patient let me know how she is doing  Orders: -     Gabapentin; Take 1 capsule (100 mg total) by mouth 3 (three) times daily.  Dispense: 90 capsule; Refill: 3  Primary hypertension Assessment & Plan: Chronic, stable.continue losartan 50mg  qd  Orders: -     Losartan Potassium; Take 1 tablet (50 mg total) by mouth daily.  Dispense: 90 tablet; Refill: 3     Return precautions given.   Risks, benefits, and alternatives of the medications and treatment plan prescribed today were discussed, and patient expressed understanding.   Education regarding symptom management and diagnosis given to patient on AVS either electronically or printed.  Return in about 6 months (around 09/01/2023).  Rennie Plowman, FNP  Subjective:    Patient ID: Brianna Wagner, female    DOB: March 22, 1966, 57 y.o.   MRN: [id number]  CC: Brianna Wagner is a 57 y.o. female who presents today for follow up.   HPI: Sleep is non restorative. She is waiting on results on home sleep study.   Complains of hot flashes episodic and occur during the day and evening 'over the past month.  She will feel damp over the nape of neck, forehead.   No weight loss, unusual bone pain, fever.   Otherwise, she feels well today.        Follow-up pulmonology 01/04/2023 regarding OSA.  Discussed trial of CPAP trial   Lab Results  Component Value Date   TSH 1.79 11/18/2022    Allergies: Desogestrel-ethinyl estradiol and Penicillins Current Outpatient Medications on File Prior to Visit  Medication Sig Dispense Refill   Multiple Vitamin (MULTIVITAMIN) tablet Take  1 tablet by mouth daily.     No current facility-administered medications on file prior to visit.    Review of Systems  Constitutional:  Positive for diaphoresis. Negative for chills, fever and unexpected weight change.  Respiratory:  Negative for cough.   Cardiovascular:  Negative for chest pain and palpitations.  Gastrointestinal:  Negative for nausea and vomiting.      Objective:    BP 126/76   Pulse 75   Temp 97.7 F (36.5 C) (Oral)   Ht 5\' 11"  (1.803 m)   Wt 252 lb 9.6 oz (114.6 kg)   LMP  (LMP Unknown)   SpO2 98%   BMI 35.23 kg/m  BP Readings from Last 3 Encounters:  03/01/23 126/76  01/04/23 128/78  11/24/22 130/70   Wt Readings from Last 3 Encounters:  03/01/23 252 lb 9.6 oz (114.6 kg)  01/04/23 257 lb (116.6 kg)  11/24/22 251 lb (113.9 kg)    Physical Exam Vitals reviewed.  Constitutional:      Appearance: She is well-developed.  Eyes:     Conjunctiva/sclera: Conjunctivae normal.  Cardiovascular:     Rate and Rhythm: Normal rate and regular rhythm.     Pulses: Normal pulses.     Heart sounds: Normal heart sounds.  Pulmonary:     Effort: Pulmonary effort is normal.     Breath sounds: Normal breath sounds. No wheezing, rhonchi or rales.  Skin:    General: Skin is warm and dry.  Neurological:     Mental Status: She is alert.  Psychiatric:        Speech: Speech normal.        Behavior: Behavior normal.        Thought Content: Thought content normal.

## 2023-03-19 ENCOUNTER — Ambulatory Visit (INDEPENDENT_AMBULATORY_CARE_PROVIDER_SITE_OTHER): Payer: BC Managed Care – PPO

## 2023-03-19 ENCOUNTER — Telehealth: Payer: Self-pay | Admitting: Pulmonary Disease

## 2023-03-19 DIAGNOSIS — G4733 Obstructive sleep apnea (adult) (pediatric): Secondary | ICD-10-CM

## 2023-03-19 NOTE — Telephone Encounter (Signed)
Call patient  Sleep study result  Date of study: 02/12/2023  Impression: Moderate obstructive sleep apnea with moderate oxygen desaturations  Recommendation: DME referral  Recommend CPAP therapy for moderate obstructive sleep apnea  Auto titrating CPAP with pressure settings of 5-15 will be appropriate  Encourage weight loss measures  Follow-up in the office 4 to 6 weeks following initiation of treatment

## 2023-03-21 NOTE — Telephone Encounter (Signed)
Please schedule CPAP follow-up in 6-8 weeks for compliance

## 2023-03-21 NOTE — Telephone Encounter (Signed)
Reviewed sleep study results with patient.  She agrees with starting auto CPAP as discussed during her last office visit.  We will place DME order.  Advised patient aim to wear CPAP nightly for 4 to 6 hours or longer.  We will schedule her follow-up in 6 to 8 weeks for CPAP compliance.

## 2023-03-21 NOTE — Telephone Encounter (Signed)
Scheduled follow up on 05/04/2023 at 9:30 am with [facility].

## 2023-03-31 NOTE — Telephone Encounter (Signed)
Patient has been scheduled with Beth. Nothing else further needed.

## 2023-05-03 ENCOUNTER — Telehealth: Payer: Self-pay | Admitting: Primary Care

## 2023-05-03 NOTE — Telephone Encounter (Signed)
PT states settings on new CPAP maybe too high because when pressure goes up later in the night she has issues exhaling. Please call to advise. Her # is [phone]

## 2023-05-04 ENCOUNTER — Ambulatory Visit: Payer: BC Managed Care – PPO | Admitting: Primary Care

## 2023-05-04 DIAGNOSIS — G4733 Obstructive sleep apnea (adult) (pediatric): Secondary | ICD-10-CM

## 2023-05-06 NOTE — Telephone Encounter (Signed)
Can you pull a download for me on her and I can review/adjust pressure settings accordingly

## 2023-05-10 ENCOUNTER — Encounter: Payer: Self-pay | Admitting: Family

## 2023-05-10 NOTE — Telephone Encounter (Signed)
Printed CPAP download compliance and gave report to [place].

## 2023-05-10 NOTE — Telephone Encounter (Signed)
Yes we can lower max pressure settings  Please place DME order to change auto pressure settings 5-10cm h20

## 2023-06-08 ENCOUNTER — Ambulatory Visit: Payer: BC Managed Care – PPO | Admitting: Family

## 2023-06-08 ENCOUNTER — Encounter: Payer: Self-pay | Admitting: Family

## 2023-06-08 VITALS — BP 136/82 | HR 59 | Temp 97.6°F | Ht 71.0 in | Wt 252.0 lb

## 2023-06-08 DIAGNOSIS — E041 Nontoxic single thyroid nodule: Secondary | ICD-10-CM

## 2023-06-08 DIAGNOSIS — I10 Essential (primary) hypertension: Secondary | ICD-10-CM | POA: Diagnosis not present

## 2023-06-08 DIAGNOSIS — R42 Dizziness and giddiness: Secondary | ICD-10-CM

## 2023-06-08 MED ORDER — HYDROCHLOROTHIAZIDE 12.5 MG PO CAPS: 12.5000 mg | ORAL_CAPSULE | Freq: Every day | ORAL | 3 refills | Status: DC

## 2023-06-08 NOTE — Assessment & Plan Note (Signed)
Overdue for follow-up surveillance of thyroid nodule.  I ordered thyroid ultrasound today

## 2023-06-08 NOTE — Patient Instructions (Addendum)
Please call  and schedule your 3D mammogram and /or bone density scan as we discussed.   [facility]  ( new location in 2023)  [address], [place], [place] [address]  [place], [place]  [phone],   Limit caffeine as dehydrating  STOP losartan ; start hydrochlorothiazide  Monitor blood pressure  Trial start allegra or zyrtec.   Let me know if symptoms were to worsen or new symptoms develop.   I ordered thyroid ultrasound  Let us know if you dont hear back within a week in regards to an appointment being scheduled.   So that you are aware, if you are [facility] MyChart user , please pay attention to your MyChart messages as you may receive a MyChart message with a phone number to call and schedule this test/appointment own your own from our referral coordinator. This is a new process so I do not want you to miss this message.  If you are not a MyChart user, you will receive a phone call.

## 2023-06-08 NOTE — Assessment & Plan Note (Addendum)
Well appearing today.  Reassuring exam. upon sitting up, she had slight self limiting sensation of dizziness while is room.  She has a history of episodic vertigo however does not feel vertigo at this time.  She is not orthostatic ( see flow sheet) She declines MRI brain, US carotid, Zio monitor.  We discussed as symptom onset correlated with stopping hydrochlorothiazide and starting losartan 50mg , returning to hydrochlorothiazide and stopping losartan 10 mg daily.  Patient to monitor blood pressure.  Discussed caffeine use ( 16-20 ounce once to twice daily) and if aggravating. Trial of antihistamine d/t nasal congestion.

## 2023-06-08 NOTE — Progress Notes (Unsigned)
Assessment & Plan:  Dizziness Assessment & Plan: Well appearing today.  Reassuring exam. upon sitting up, she had slight self limiting sensation of dizziness while is room.  She has a history of episodic vertigo however does not feel vertigo at this time.  She is not orthostatic ( see flow sheet) She declines MRI brain, US carotid, Zio monitor.  We discussed as symptom onset correlated with stopping hydrochlorothiazide and starting losartan 50mg , returning to hydrochlorothiazide and stopping losartan 10 mg daily.  Patient to monitor blood pressure.  Discussed caffeine use ( 16-20 ounce once to twice daily) and if aggravating. Trial of antihistamine d/t nasal congestion.   Orders: -     3D Screening Mammogram, Left and Right; Future -     CBC with Differential/Platelet -     Hemoglobin A1c  Thyroid nodule Assessment & Plan: Overdue for follow-up surveillance of thyroid nodule.  I ordered thyroid ultrasound today  Orders: -     US THYROID; Future -     TSH  Primary hypertension -     hydroCHLOROthiazide; Take 1 capsule (12.5 mg total) by mouth daily.  Dispense: 90 capsule; Refill: 3 -     COMPLETE METABOLIC PANEL WITH eGFR     Return precautions given.   Risks, benefits, and alternatives of the medications and treatment plan prescribed today were discussed, and patient expressed understanding.   Education regarding symptom management and diagnosis given to patient on AVS either electronically or printed.  Return in about 1 week (around 06/15/2023).  Rennie Plowman, FNP  Subjective:    Patient ID: Brianna Wagner, female    DOB: 1965-08-20, 57 y.o.   MRN: [id number]  CC: Brianna Wagner is a 57 y.o. female who presents today for an acute visit.    HPI: Complains of dizziness over the last several months. She states feeling 'unbalanced'. She was leaning. She doesn't remind her of vertigo.   Dizzy is more noticeable in the morning. She an episode 14 days ago when getting into the  shower. She describes a as 'cloudy, hazy feeling'.   She notices it when she bends over or stands up.   Endorses sinus congestion which she thinks it r/t allergies She takes sudafed prn.   She is drinking less alcohol.  No palpitations, cp, left arm numbness, syncope, HA, vision loss, ear pain, hearing loss, tinnitus, nausea.     Drinking plenty of water.   She drinks 16-20 ounce coffee daily; sometimes two of these.    Compliant with losartan 50 mg daily. Most recently hydrochlorothiazide was stopped.   In the past she has been on amlodipine, hydrochlorothiazide  She is not taking gabapentin   Allergies: Desogestrel-ethinyl estradiol and Penicillins Current Outpatient Medications on File Prior to Visit  Medication Sig Dispense Refill   Multiple Vitamin (MULTIVITAMIN) tablet Take 1 tablet by mouth daily.     No current facility-administered medications on file prior to visit.    Review of Systems  Constitutional:  Negative for chills and fever.  Respiratory:  Negative for cough.   Cardiovascular:  Negative for chest pain and palpitations.  Gastrointestinal:  Negative for nausea and vomiting.  Neurological:  Positive for dizziness. Negative for seizures, weakness and numbness.      Objective:    BP 136/82   Pulse (!) 59   Temp 97.6 F (36.4 C) (Oral)   Ht 5\' 11"  (1.803 m)   Wt 252 lb (114.3 kg)   SpO2 97%   BMI 35.15 kg/m  BP Readings from Last 3 Encounters:  06/08/23 136/82  03/01/23 126/76  01/04/23 128/78   Wt Readings from Last 3 Encounters:  06/08/23 252 lb (114.3 kg)  03/01/23 252 lb 9.6 oz (114.6 kg)  01/04/23 257 lb (116.6 kg)  Orthostatic VS for the past 24 hrs (Last 3 readings):  BP- Lying Pulse- Lying BP- Sitting Pulse- Sitting BP- Standing at 0 minutes Pulse- Standing at 0 minutes  06/08/23 1209 130/84 67 138/86 85 130/82 78     Physical Exam Vitals reviewed.  Constitutional:      Appearance: She is well-developed.  HENT:     Head:  Normocephalic and atraumatic.     Right Ear: Hearing, tympanic membrane, ear canal and external ear normal. No swelling or tenderness. No middle ear effusion. Tympanic membrane is not erythematous or bulging.     Left Ear: Tympanic membrane, ear canal and external ear normal. No swelling or tenderness.  No middle ear effusion. Tympanic membrane is not erythematous or bulging.     Nose: Nose normal. No rhinorrhea.     Right Sinus: No maxillary sinus tenderness or frontal sinus tenderness.     Left Sinus: No maxillary sinus tenderness or frontal sinus tenderness.     Mouth/Throat:     Pharynx: Uvula midline. No posterior oropharyngeal erythema.  Eyes:     General: Lids are normal. Lids are everted, no foreign bodies appreciated.     Conjunctiva/sclera: Conjunctivae normal.     Pupils: Pupils are equal, round, and reactive to light.     Comments: Normal fundus bilaterally   Cardiovascular:     Rate and Rhythm: Normal rate and regular rhythm.     Pulses: Normal pulses.     Heart sounds: Normal heart sounds.  Pulmonary:     Effort: Pulmonary effort is normal.     Breath sounds: Normal breath sounds. No wheezing, rhonchi or rales.  Lymphadenopathy:     Head:     Right side of head: No submental, submandibular, tonsillar, preauricular, posterior auricular or occipital adenopathy.     Left side of head: No submental, submandibular, tonsillar, preauricular, posterior auricular or occipital adenopathy.     Cervical: No cervical adenopathy.     Right cervical: No superficial, deep or posterior cervical adenopathy.    Left cervical: No superficial, deep or posterior cervical adenopathy.  Skin:    General: Skin is warm and dry.  Neurological:     Mental Status: She is alert.     Cranial Nerves: No cranial nerve deficit.     Sensory: No sensory deficit.     Deep Tendon Reflexes:     Reflex Scores:      Bicep reflexes are 2+ on the right side and 2+ on the left side.      Patellar reflexes are  2+ on the right side and 2+ on the left side.    Comments: Grip equal and strong bilateral upper extremities. Gait strong and steady. Able to perform  finger-to-nose without difficulty.   Psychiatric:        Speech: Speech normal.        Behavior: Behavior normal.        Thought Content: Thought content normal.

## 2023-06-09 LAB — CBC WITH DIFFERENTIAL/PLATELET
Absolute Lymphocytes: 1320 {cells}/uL (ref 850–3900)
Absolute Monocytes: 340 {cells}/uL (ref 200–950)
Basophils Absolute: 60 {cells}/uL (ref 0–200)
Basophils Relative: 1.5 %
Eosinophils Absolute: 92 {cells}/uL (ref 15–500)
Eosinophils Relative: 2.3 %
HCT: 39.1 % (ref 35.0–45.0)
Hemoglobin: 12.6 g/dL (ref 11.7–15.5)
MCH: 29.4 pg (ref 27.0–33.0)
MCHC: 32.2 g/dL (ref 32.0–36.0)
MCV: 91.4 fL (ref 80.0–100.0)
MPV: 10.6 fL (ref 7.5–12.5)
Monocytes Relative: 8.5 %
Neutro Abs: 2188 {cells}/uL (ref 1500–7800)
Neutrophils Relative %: 54.7 %
Platelets: 292 10*3/uL (ref 140–400)
RBC: 4.28 10*6/uL (ref 3.80–5.10)
RDW: 13.1 % (ref 11.0–15.0)
Total Lymphocyte: 33 %
WBC: 4 10*3/uL (ref 3.8–10.8)

## 2023-06-09 LAB — COMPLETE METABOLIC PANEL WITH GFR
AG Ratio: 1.8 (calc) (ref 1.0–2.5)
ALT: 20 U/L (ref 6–29)
AST: 19 U/L (ref 10–35)
Albumin: 4.4 g/dL (ref 3.6–5.1)
Alkaline phosphatase (APISO): 76 U/L (ref 37–153)
BUN: 19 mg/dL (ref 7–25)
CO2: 28 mmol/L (ref 20–32)
Calcium: 10.3 mg/dL (ref 8.6–10.4)
Chloride: 101 mmol/L (ref 98–110)
Creat: 0.94 mg/dL (ref 0.50–1.03)
Globulin: 2.5 g/dL (ref 1.9–3.7)
Glucose, Bld: 93 mg/dL (ref 65–99)
Potassium: 4.7 mmol/L (ref 3.5–5.3)
Sodium: 138 mmol/L (ref 135–146)
Total Bilirubin: 0.7 mg/dL (ref 0.2–1.2)
Total Protein: 6.9 g/dL (ref 6.1–8.1)
eGFR: 71 mL/min/{1.73_m2} (ref >=60)

## 2023-06-09 LAB — HEMOGLOBIN A1C
Hgb A1c MFr Bld: 5.5 %{Hb} (ref <5.7)
Mean Plasma Glucose: 111 mg/dL
eAG (mmol/L): 6.2 mmol/L

## 2023-06-09 LAB — TSH: TSH: 1.94 m[IU]/L (ref 0.40–4.50)

## 2023-06-16 ENCOUNTER — Ambulatory Visit: Payer: BC Managed Care – PPO | Admitting: Primary Care

## 2023-06-20 ENCOUNTER — Ambulatory Visit
Admission: RE | Admit: 2023-06-20 | Discharge: 2023-06-20 | Disposition: A | Discharge status: Home / Self Care | Payer: BC Managed Care – PPO | Source: Ambulatory Visit | Attending: Family | Admitting: Family

## 2023-06-20 ENCOUNTER — Ambulatory Visit: Payer: BC Managed Care – PPO | Admitting: Family

## 2023-06-20 ENCOUNTER — Encounter: Payer: Self-pay | Admitting: Family

## 2023-06-20 ENCOUNTER — Other Ambulatory Visit: Payer: Self-pay | Admitting: Family

## 2023-06-20 VITALS — BP 132/82 | HR 78 | Temp 97.9°F | Ht 71.0 in | Wt 255.2 lb

## 2023-06-20 DIAGNOSIS — N644 Mastodynia: Secondary | ICD-10-CM | POA: Insufficient documentation

## 2023-06-20 DIAGNOSIS — R42 Dizziness and giddiness: Secondary | ICD-10-CM | POA: Diagnosis not present

## 2023-06-20 DIAGNOSIS — E041 Nontoxic single thyroid nodule: Secondary | ICD-10-CM | POA: Insufficient documentation

## 2023-06-20 NOTE — Progress Notes (Signed)
Assessment & Plan:  Breast pain, right Assessment & Plan: Pending diagnostic images  Orders: -     MM 3D DIAGNOSTIC MAMMOGRAM UNILATERAL RIGHT BREAST; Future -     MM 3D DIAGNOSTIC MAMMOGRAM BILATERAL BREAST; Future  Dizziness Assessment & Plan: Resolved with discontinuation of losartan.  She is compliant with hydrochlorothiazide 12.5 mg daily. Politely declines labs today. Will monitor for recurrence.       Return precautions given.   Risks, benefits, and alternatives of the medications and treatment plan prescribed today were discussed, and patient expressed understanding.   Education regarding symptom management and diagnosis given to patient on AVS either electronically or printed.  Return in about 6 months (around 12/18/2023).  Rennie Plowman, FNP  Subjective:    Patient ID: [name], female    DOB: [birth year], 57 y.o.   MRN: [id number]  CC: Kingsley Grohowski is a 57 y.o. female who presents today for follow up.   HPI:  Follow-up dizziness She is no longer on losartan.  She is compliant with hydrochlorothiazide  12.5mg  . Dizziness has resolved.   Complains of right sided breast pain 'all the time' , started 6 months ago, unchanged.   No injury. Feels 'like bone hurts'. No pain with reaching.  Pain is worse when washing the area and pressing on the area. She questions if bra is contributory.    No rash, skin lesions.   Mammogram is scheduled.   No pain with eating.  No back pain, abdominal pain.     Allergies: Desogestrel-ethinyl estradiol and Penicillins Current Outpatient Medications on File Prior to Visit  Medication Sig Dispense Refill   hydrochlorothiazide (MICROZIDE) 12.5 MG capsule Take 1 capsule (12.5 mg total) by mouth daily. 90 capsule 3   Multiple Vitamin (MULTIVITAMIN) tablet Take 1 tablet by mouth daily.     No current facility-administered medications on file prior to visit.    Review of Systems  Constitutional:  Negative for chills  and fever.  Respiratory:  Negative for cough.   Cardiovascular:  Negative for chest pain and palpitations.  Gastrointestinal:  Negative for nausea and vomiting.  Neurological:  Negative for dizziness (resolved).      Objective:    BP 132/82   Pulse 78   Temp 97.9 F (36.6 C) (Oral)   Ht 5\' 11"  (1.803 m)   Wt 255 lb 3.2 oz (115.8 kg)   SpO2 99%   BMI 35.59 kg/m  BP Readings from Last 3 Encounters:  06/20/23 132/82  06/08/23 136/82  03/01/23 126/76   Wt Readings from Last 3 Encounters:  06/20/23 255 lb 3.2 oz (115.8 kg)  06/08/23 252 lb (114.3 kg)  03/01/23 252 lb 9.6 oz (114.6 kg)    Physical Exam Vitals reviewed.  Constitutional:      Appearance: She is well-developed.  Eyes:     Conjunctiva/sclera: Conjunctivae normal.  Cardiovascular:     Rate and Rhythm: Normal rate and regular rhythm.     Pulses: Normal pulses.     Heart sounds: Normal heart sounds.  Pulmonary:     Effort: Pulmonary effort is normal.     Breath sounds: Normal breath sounds. No wheezing, rhonchi or rales.  Chest:       Comments: Right outer quadrant diffuse tenderness with palpation.No circumscribed mass.  No skin changes including erythema, increased warmth or rash. Skin:    General: Skin is warm and dry.  Neurological:     Mental Status: She is alert.  Psychiatric:  Speech: Speech normal.        Behavior: Behavior normal.        Thought Content: Thought content normal.

## 2023-06-20 NOTE — Assessment & Plan Note (Signed)
Pending diagnostic images

## 2023-06-20 NOTE — Patient Instructions (Signed)
I have ordered a diagnostic mammogram and diagnostic ultrasound.  Please call [facility] to schedule

## 2023-06-20 NOTE — Assessment & Plan Note (Signed)
Resolved with discontinuation of losartan.  She is compliant with hydrochlorothiazide 12.5 mg daily. Politely declines labs today. Will monitor for recurrence.

## 2023-06-23 ENCOUNTER — Ambulatory Visit
Admission: RE | Admit: 2023-06-23 | Discharge: 2023-06-23 | Disposition: A | Discharge status: Home / Self Care | Payer: BC Managed Care – PPO | Source: Ambulatory Visit | Attending: Family | Admitting: Family

## 2023-06-23 ENCOUNTER — Encounter: Payer: Self-pay | Admitting: Radiology

## 2023-06-23 DIAGNOSIS — N644 Mastodynia: Secondary | ICD-10-CM

## 2023-06-30 ENCOUNTER — Encounter: Payer: Self-pay | Admitting: Family

## 2023-07-01 ENCOUNTER — Encounter: Payer: Self-pay | Admitting: *Deleted

## 2023-07-05 ENCOUNTER — Telehealth: Payer: Self-pay

## 2023-07-05 NOTE — Telephone Encounter (Signed)
LVM to call back to office to go over results 

## 2023-07-12 ENCOUNTER — Encounter: Payer: Self-pay | Admitting: *Deleted

## 2023-09-01 ENCOUNTER — Ambulatory Visit: Payer: BC Managed Care – PPO | Admitting: Family

## 2023-12-19 ENCOUNTER — Ambulatory Visit: Payer: BC Managed Care – PPO | Admitting: Family

## 2023-12-28 ENCOUNTER — Ambulatory Visit: Admitting: Family

## 2023-12-28 ENCOUNTER — Encounter: Payer: Self-pay | Admitting: Family

## 2023-12-28 VITALS — BP 132/76 | HR 76 | Temp 97.8°F | Ht 71.0 in | Wt 253.2 lb

## 2023-12-28 DIAGNOSIS — I10 Essential (primary) hypertension: Secondary | ICD-10-CM | POA: Diagnosis not present

## 2023-12-28 DIAGNOSIS — R1013 Epigastric pain: Secondary | ICD-10-CM | POA: Diagnosis not present

## 2023-12-28 DIAGNOSIS — I700 Atherosclerosis of aorta: Secondary | ICD-10-CM

## 2023-12-28 MED ORDER — PANTOPRAZOLE SODIUM 20 MG PO TBEC: 20.0000 mg | DELAYED_RELEASE_TABLET | ORAL | 1 refills | Status: AC

## 2023-12-28 NOTE — Assessment & Plan Note (Addendum)
 Chronic, stable.continue hydrochlorothiazide  12.5mg .  Consider Imdur

## 2023-12-28 NOTE — Patient Instructions (Addendum)
 Trial protonix daily for 14 days.   Please stay very vigilant as I am concerned with your chest pain as we do not have a diagnosis at this time. You have a family history or heart disease and I recommend follow up with Dr Gollan.    GERD in Adults: Diet Changes When you have gastroesophageal reflux disease (GERD), you may need to make changes to your diet. Choosing the right foods can help with your symptoms. Think about working with an expert in healthy eating called a dietitian. They can help you make healthy food choices. What are tips for following this plan? Reading food labels Look for foods that are low in saturated fat. Foods that may help with your symptoms include: Foods with less than 5% of daily value (DV) of fat. Foods with 0 grams of trans fat. Cooking [employer] in ways that don't use a lot of fat. These ways include: Baking. Steaming. Grilling. Broiling. To add flavor, try to use herbs that are low in spice and acidity. Avoid frying your food. Meal planning  Eat small meals often rather than eating 3 large meals each day. Eat your meals slowly in a place where you feel relaxed. If told by your health care provider, avoid: Foods that cause symptoms. Keep a food diary to keep track of foods that cause symptoms. Alcohol. Drinking a lot of liquid with meals. General instructions For 2-3 hours after you eat, avoid: Bending over. Exercise. Lying down. Chew sugar-free gum after meals. What foods should I eat? Eat a healthy diet. Try to include: Foods with high amounts of fiber. These include: Fruits and vegetables. Whole grains and beans. Low-fat dairy products. Lean meats, fish, and poultry. Egg whites. Foods that cause symptoms in someone else may not cause symptoms for you. Work with your provider to find foods that are safe for you. The items listed above may not be all the foods and drinks you can have. Talk with a dietitian to learn more. The items  listed above may not be a complete list of foods and beverages you can eat and drink. Contact a dietitian for more information. What foods should I avoid? Limiting some of these foods may help with your symptoms. Each person is different. Talk with a dietitian or your provider to help you find the exact foods to avoid. Some of the foods to avoid may include: Fruits Fruits with a lot of acid in them. These may include citrus fruits, such as oranges, grapefruit, pineapple, and lemons. Vegetables Deep-fried vegetables, such as [place] fries. Vegetables, sauces, or toppings made with added fat and vegetables with acid in them. These may include tomatoes and tomato products, chili peppers, onions, garlic, and horseradish. Grains Pastries or quick breads with added fat. Meats and other proteins High-fat meats, such as fatty beef or pork, hot dogs, ribs, ham, sausage, salami, and bacon. Fried meat or protein, such as fried fish and fried chicken. Egg yolks. Fats and oils Butter. Margarine. Shortening. Ghee. Drinks Coffee and other drinks with caffeine in them. Fizzy and sugary drinks, such as soda and energy drinks. Fruit juice made with acidic fruits, such as orange or grapefruit. Tomato juice. Sweets and desserts Chocolate and cocoa. Donuts. Seasonings and condiments Mint, such as peppermint and spearmint. Condiments, herbs, or seasonings that cause symptoms. These may include curry, hot sauce, or vinegar-based salad dressings. The items listed above may not be all the foods and drinks you should avoid. Talk with a dietitian to  learn more. Questions to ask your health care provider Changes to your diet and everyday life are often the first steps taken to manage symptoms of GERD. If these changes don't help, talk with your provider about taking medicines. Where to find more information [employer]: aboutgerd.org This information is not intended to  replace advice given to you by your health care provider. Make sure you discuss any questions you have with your health care provider. Document Revised: 06/07/2023 Document Reviewed: 12/22/2022 Elsevier Patient Education  2024 [employer]  Nonspecific Chest Pain Chest pain can be caused by many different conditions. Some causes of chest pain can be life-threatening. These will require treatment right away. Serious causes of chest pain include: Heart attack. A tear in the body's main blood vessel. Redness and swelling (inflammation) around your heart. Blood clot in your lungs. Other causes of chest pain may not be so serious. These include: Heartburn. Anxiety or stress. Damage to bones or muscles in your chest. Lung infections. Chest pain can feel like: Pain or discomfort in your chest. Crushing, pressure, aching, or squeezing pain. Burning or tingling. Dull or sharp pain that is worse when you move, cough, or take a deep breath. Pain or discomfort that is also felt in your back, neck, jaw, shoulder, or arm, or pain that spreads to any of these areas. It is hard to know whether your pain is caused by something that is serious or something that is not so serious. So it is important to see your doctor right away if you have chest pain. Follow these instructions at home: Medicines Take over-the-counter and prescription medicines only as told by your doctor. If you were prescribed an antibiotic medicine, take it as told by your doctor. Do not stop taking the antibiotic even if you start to feel better. Lifestyle  Rest as told by your doctor. Do not use any products that contain nicotine or tobacco, such as cigarettes, e-cigarettes, and chewing tobacco. If you need help quitting, ask your doctor. Do not drink alcohol. Make lifestyle changes as told by your doctor. These may include: Getting regular exercise. Ask your doctor what activities are safe for you. Eating a heart-healthy diet.  A diet and nutrition specialist (dietitian) can help you to learn healthy eating options. Staying at a healthy weight. Treating diabetes or high blood pressure, if needed. Lowering your stress. Activities such as yoga and relaxation techniques can help. General instructions Pay attention to any changes in your symptoms. Tell your doctor about them or any new symptoms. Avoid any activities that cause chest pain. Keep all follow-up visits as told by your doctor. This is important. You may need more testing if your chest pain does not go away. Contact a doctor if: Your chest pain does not go away. You feel depressed. You have a fever. Get help right away if: Your chest pain is worse. You have a cough that gets worse, or you cough up blood. You have very bad (severe) pain in your belly (abdomen). You pass out (faint). You have either of these for no clear reason: Sudden chest discomfort. Sudden discomfort in your arms, back, neck, or jaw. You have shortness of breath at any time. You suddenly start to sweat, or your skin gets clammy. You feel sick to your stomach (nauseous). You throw up (vomit). You suddenly feel lightheaded or dizzy. You feel very weak or tired. Your heart starts to beat fast, or it feels like it is skipping beats.  These symptoms may be an emergency. Do not wait to see if the symptoms will go away. Get medical help right away. Call your local emergency services (911 in the [place] Do not drive yourself to the hospital. Summary Chest pain can be caused by many different conditions. The cause may be serious and need treatment right away. If you have chest pain, see your doctor right away. Follow your doctor's instructions for taking medicines and making lifestyle changes. Keep all follow-up visits as told by your doctor. This includes visits for any further testing if your chest pain does not go away. Be sure to know the signs that show that your condition has become  worse. Get help right away if you have these symptoms. This information is not intended to replace advice given to you by your health care provider. Make sure you discuss any questions you have with your health care provider. Document Revised: 06/10/2022 Document Reviewed: 06/10/2022 Elsevier Patient Education  2024 [employer].

## 2023-12-28 NOTE — Assessment & Plan Note (Addendum)
 Unable to reproduce chest pain on exam.  No chest pain today.  Patient adamantly declines EKG today ; she describes symptom is unchanged over the last 6 years.  Reviewed previous workup including stress test 2019, CT calcium score of 0.  She has a family history of early heart disease. Strongly advised patient, again, return to follow-up to cardiology for further evaluation for her safety. She politely declined.  She is willing to trial Protonix.  Discussed coffee use, 56 ounces per day, and agree with patient may likely be contributing.

## 2023-12-28 NOTE — Progress Notes (Signed)
 Assessment & Plan:  Epigastric pain Assessment & Plan: Unable to reproduce chest pain on exam.  No chest pain today.  Patient adamantly declines EKG today ; she describes symptom is unchanged over the last 6 years.  Reviewed previous workup including stress test 2019, CT calcium score of 0.  She has a family history of early heart disease. Strongly advised patient, again, return to follow-up to cardiology for further evaluation for her safety. She politely declined.  She is willing to trial Protonix.  Discussed coffee use, 56 ounces per day, and agree with patient may likely be contributing.   Orders: -     Pantoprazole Sodium; Take 1 tablet (20 mg total) by mouth every morning. Take 30 minutes to hour before breakfast  Dispense: 30 tablet; Refill: 1  Primary hypertension Assessment & Plan: Chronic, stable.continue hydrochlorothiazide  12.5mg .  Consider Imdur  Orders: -     TSH -     Comprehensive metabolic panel with GFR  Atherosclerosis of aorta (HCC) -     Lipid panel     Return precautions given.   Risks, benefits, and alternatives of the medications and treatment plan prescribed today were discussed, and patient expressed understanding.   Education regarding symptom management and diagnosis given to patient on AVS either electronically or printed.  Return in about 6 weeks (around 02/08/2024).  Bascom Bossier, FNP  Subjective:    Patient ID: Brianna Wagner, female    DOB: [birth year], 58 y.o.   MRN: [id number]  CC: Brianna Wagner is a 58 y.o. female who presents today for follow up.   HPI: Occassional 'epigastric discomfort' 2 days per week, ongoing for 7 years. No cp today.  She is not overly concerned with chest pain.     Thinks coffee and stress may be related. She drinks coffee at least three to four 12-14 ounces cups per day.  She describes a heaviness which will usually start in the morning after coffee. As she gets busy throughout her day, CP becomes less  noticeable. She will take a deep breath when pain starts with some relief.   She never started PPI for symptoms.    No SOB, vomiting, abdominal pain, left arm numbness, cough, Nm diaphoresis.  She is walking for exercise, goal 10,000 steps per day.  No CP with walking.   No nsaids.  She has reduced alcohol use.   Compliant with hydrochlorothiazide  12.5mg  She is no longer on losartan      Allergies: Desogestrel-ethinyl estradiol and Penicillins Current Outpatient Medications on File Prior to Visit  Medication Sig Dispense Refill   hydrochlorothiazide  (MICROZIDE ) 12.5 MG capsule Take 1 capsule (12.5 mg total) by mouth daily. 90 capsule 3   Multiple Vitamin (MULTIVITAMIN) tablet Take 1 tablet by mouth daily.     No current facility-administered medications on file prior to visit.    Review of Systems  Constitutional:  Negative for chills and fever.  Respiratory:  Negative for cough.   Cardiovascular:  Negative for chest pain and palpitations.  Gastrointestinal:  Negative for nausea and vomiting.      Objective:    BP 132/76   Pulse 76   Temp 97.8 F (36.6 C) (Oral)   Ht 5\' 11"  (1.803 m)   Wt 253 lb 3.2 oz (114.9 kg)   LMP  (LMP Unknown)   SpO2 97%   BMI 35.31 kg/m  BP Readings from Last 3 Encounters:  12/28/23 132/76  06/20/23 132/82  06/08/23 136/82   Wt Readings from Last 3  Encounters:  12/28/23 253 lb 3.2 oz (114.9 kg)  06/20/23 255 lb 3.2 oz (115.8 kg)  06/08/23 252 lb (114.3 kg)    Physical Exam Vitals reviewed.  Constitutional:      Appearance: She is well-developed.  Eyes:     Conjunctiva/sclera: Conjunctivae normal.  Cardiovascular:     Rate and Rhythm: Normal rate and regular rhythm.     Pulses: Normal pulses.     Heart sounds: Normal heart sounds.  Pulmonary:     Effort: Pulmonary effort is normal.     Breath sounds: Normal breath sounds. No wheezing, rhonchi or rales.  Chest:     Chest wall: No tenderness.  Skin:    General: Skin is warm  and dry.  Neurological:     Mental Status: She is alert.  Psychiatric:        Speech: Speech normal.        Behavior: Behavior normal.        Thought Content: Thought content normal.

## 2023-12-29 LAB — TSH: TSH: 2.01 m[IU]/L (ref 0.40–4.50)

## 2023-12-30 ENCOUNTER — Ambulatory Visit: Payer: Self-pay | Admitting: Family

## 2023-12-31 ENCOUNTER — Encounter: Payer: Self-pay | Admitting: Family

## 2024-01-10 ENCOUNTER — Telehealth: Payer: Self-pay | Admitting: Family

## 2024-01-10 NOTE — Telephone Encounter (Signed)
 Spoke with pt  She is drinking coffee now . Feels well.  Denies recurrence of CP in the past 2 weeks ( since our appointment) She has yet to start protonix  yet however she plans to pick up.   We discussed if after protonix  CP resolves, we will monitor  If CP persists, we agreed to arrange cardiology follow up . Patient verbalized understanding and will send me a note to let me know how she is doing  She agrees with cardiology followup if CP recurs on protonix 

## 2024-02-14 ENCOUNTER — Ambulatory Visit: Admitting: Family

## 2024-06-08 ENCOUNTER — Other Ambulatory Visit: Payer: Self-pay | Admitting: Family

## 2024-06-08 DIAGNOSIS — I10 Essential (primary) hypertension: Secondary | ICD-10-CM

## 2024-09-12 ENCOUNTER — Other Ambulatory Visit: Payer: Self-pay | Admitting: Family

## 2024-09-12 DIAGNOSIS — I10 Essential (primary) hypertension: Secondary | ICD-10-CM
